# Patient Record
Sex: Female | Born: 1980 | Race: Black or African American | Hispanic: Yes | Smoking: Former smoker
Health system: Southern US, Community
[De-identification: ages and names within clinical notes are randomized; demographics above are authoritative.]

## PROBLEM LIST (undated history)

## (undated) DIAGNOSIS — F32A Depression, unspecified: Secondary | ICD-10-CM

## (undated) DIAGNOSIS — B999 Unspecified infectious disease: Secondary | ICD-10-CM

## (undated) DIAGNOSIS — N750 Cyst of Bartholin's gland: Secondary | ICD-10-CM

## (undated) DIAGNOSIS — F329 Major depressive disorder, single episode, unspecified: Secondary | ICD-10-CM

## (undated) HISTORY — PX: BARTHOLIN GLAND CYST EXCISION: SHX565

---

## 2010-07-09 ENCOUNTER — Emergency Department (HOSPITAL_COMMUNITY): Payer: Medicaid Other

## 2010-07-09 ENCOUNTER — Other Ambulatory Visit (HOSPITAL_COMMUNITY): Payer: Self-pay

## 2010-07-09 ENCOUNTER — Emergency Department (HOSPITAL_COMMUNITY)
Admission: EM | Admit: 2010-07-09 | Discharge: 2010-07-09 | Disposition: A | Discharge status: Home / Self Care | Payer: Medicaid Other | Attending: Emergency Medicine | Admitting: Emergency Medicine

## 2010-07-09 DIAGNOSIS — R109 Unspecified abdominal pain: Secondary | ICD-10-CM | POA: Insufficient documentation

## 2010-07-09 DIAGNOSIS — R42 Dizziness and giddiness: Secondary | ICD-10-CM | POA: Insufficient documentation

## 2010-07-09 DIAGNOSIS — O9989 Other specified diseases and conditions complicating pregnancy, childbirth and the puerperium: Secondary | ICD-10-CM | POA: Insufficient documentation

## 2010-07-09 DIAGNOSIS — R110 Nausea: Secondary | ICD-10-CM | POA: Insufficient documentation

## 2010-07-09 DIAGNOSIS — R51 Headache: Secondary | ICD-10-CM | POA: Insufficient documentation

## 2010-07-09 DIAGNOSIS — N898 Other specified noninflammatory disorders of vagina: Secondary | ICD-10-CM | POA: Insufficient documentation

## 2010-07-09 LAB — DIFFERENTIAL
Basophils Absolute: 0 10*3/uL (ref 0.0–0.1)
Basophils Relative: 0 % (ref 0–1)
Eosinophils Relative: 0 % (ref 0–5)
Monocytes Absolute: 0.6 10*3/uL (ref 0.1–1.0)

## 2010-07-09 LAB — POCT PREGNANCY, URINE: Preg Test, Ur: POSITIVE

## 2010-07-09 LAB — CBC
MCHC: 34.4 g/dL (ref 30.0–36.0)
Platelets: 192 10*3/uL (ref 150–400)
RDW: 13 % (ref 11.5–15.5)
WBC: 10.9 10*3/uL — ABNORMAL HIGH (ref 4.0–10.5)

## 2010-07-09 LAB — WET PREP, GENITAL
Trich, Wet Prep: NONE SEEN
Yeast Wet Prep HPF POC: NONE SEEN

## 2010-07-09 LAB — URINALYSIS, ROUTINE W REFLEX MICROSCOPIC
Ketones, ur: 15 mg/dL — AB
Nitrite: NEGATIVE
Specific Gravity, Urine: 1.026 (ref 1.005–1.030)
Urobilinogen, UA: 1 mg/dL (ref 0.0–1.0)

## 2010-07-09 LAB — POCT I-STAT, CHEM 8
Chloride: 106 mEq/L (ref 96–112)
HCT: 43 % (ref 36.0–46.0)
Hemoglobin: 14.6 g/dL (ref 12.0–15.0)
Potassium: 4.1 mEq/L (ref 3.5–5.1)
Sodium: 137 mEq/L (ref 135–145)

## 2010-07-10 LAB — GC/CHLAMYDIA PROBE AMP, GENITAL: Chlamydia, DNA Probe: NEGATIVE

## 2010-08-17 ENCOUNTER — Emergency Department (HOSPITAL_COMMUNITY)
Admission: EM | Admit: 2010-08-17 | Discharge: 2010-08-17 | Disposition: A | Discharge status: Home / Self Care | Payer: Medicaid Other | Attending: Emergency Medicine | Admitting: Emergency Medicine

## 2010-08-17 DIAGNOSIS — O99891 Other specified diseases and conditions complicating pregnancy: Secondary | ICD-10-CM | POA: Insufficient documentation

## 2010-08-17 DIAGNOSIS — O200 Threatened abortion: Secondary | ICD-10-CM | POA: Insufficient documentation

## 2010-08-17 DIAGNOSIS — R109 Unspecified abdominal pain: Secondary | ICD-10-CM | POA: Insufficient documentation

## 2010-08-17 LAB — URINALYSIS, ROUTINE W REFLEX MICROSCOPIC
Bilirubin Urine: NEGATIVE
Nitrite: NEGATIVE
Specific Gravity, Urine: 1.019 (ref 1.005–1.030)
Urobilinogen, UA: 1 mg/dL (ref 0.0–1.0)

## 2010-08-17 LAB — CBC
HCT: 33.4 % — ABNORMAL LOW (ref 36.0–46.0)
MCHC: 34.4 g/dL (ref 30.0–36.0)
MCV: 84.1 fL (ref 78.0–100.0)
RDW: 13 % (ref 11.5–15.5)

## 2010-08-17 LAB — DIFFERENTIAL
Basophils Absolute: 0 10*3/uL (ref 0.0–0.1)
Eosinophils Relative: 0 % (ref 0–5)
Lymphocytes Relative: 22 % (ref 12–46)
Monocytes Absolute: 0.5 10*3/uL (ref 0.1–1.0)

## 2010-08-17 LAB — WET PREP, GENITAL: Clue Cells Wet Prep HPF POC: NONE SEEN

## 2010-08-17 LAB — URINE MICROSCOPIC-ADD ON

## 2010-08-22 ENCOUNTER — Inpatient Hospital Stay (HOSPITAL_COMMUNITY)
Admission: EM | Admit: 2010-08-22 | Discharge: 2010-08-22 | Disposition: A | Discharge status: Home / Self Care | Payer: Medicaid Other | Source: Ambulatory Visit | Attending: Obstetrics & Gynecology | Admitting: Obstetrics & Gynecology

## 2010-08-22 ENCOUNTER — Encounter (HOSPITAL_COMMUNITY): Payer: Self-pay

## 2010-08-22 DIAGNOSIS — N750 Cyst of Bartholin's gland: Secondary | ICD-10-CM | POA: Insufficient documentation

## 2010-08-22 DIAGNOSIS — N751 Abscess of Bartholin's gland: Secondary | ICD-10-CM

## 2010-08-22 HISTORY — DX: Cyst of Bartholin's gland: N75.0

## 2010-08-22 MED ORDER — HYDROCODONE-ACETAMINOPHEN 5-500 MG PO TABS: 1.0000 | ORAL_TABLET | Freq: Four times a day (QID) | ORAL | Status: DC | PRN

## 2010-08-22 MED ORDER — HYDROCODONE-ACETAMINOPHEN 5-325 MG PO TABS
1.0000 | ORAL_TABLET | Freq: Once | ORAL | Status: AC
  Administered 2010-08-22: 1 via ORAL
  Filled 2010-08-22: qty 1

## 2010-08-22 MED ORDER — LIDOCAINE HCL 2 % EX GEL
Freq: Once | CUTANEOUS | Status: AC
  Administered 2010-08-22: 20 via TOPICAL
  Filled 2010-08-22: qty 20

## 2010-08-22 MED ORDER — CEPHALEXIN 500 MG PO CAPS: 500.0000 mg | ORAL_CAPSULE | Freq: Three times a day (TID) | ORAL | Status: DC

## 2010-08-22 NOTE — ED Provider Notes (Signed)
History    General Appearance:    Alert, cooperative, no distress, appears stated age  Head:    Normocephalic, without obvious abnormality, atraumatic  Back:     Symmetric, no curvature, ROM normal, no CVA tenderness  Lungs:      respirations unlabored  Genitalia:    Normal female without lesions  Extremities:   Extremities normal, atraumatic, no cyanosis or edema  Skin:   Skin color, texture, turgor normal, no rashes or lesions  Neurologic: Alert and oriented, normal gait.    Chief Complaint  Patient presents with  . Bartholin's Cyst   The history is provided by the patient.    OB History    Grav Para Term Preterm Abortions TAB SAB Ect Mult Living   7 5 5  1 1    5       Past Medical History  Diagnosis Date  . Bartholin cyst     Past Surgical History  Procedure Date  . Bartholin gland cyst excision     No family history on file.  History  Substance Use Topics  . Smoking status: Former Games developer  . Smokeless tobacco: Never Used  . Alcohol Use: No    Allergies: No Known Allergies  No prescriptions prior to admission   Swelling and pain left vaginal area. Review of Systems  All other systems reviewed and are negative.   Physical Exam   Blood pressure 113/64, pulse 103, temperature 98.4 F (36.9 C), temperature source Oral, resp. rate 16, height 5\' 4"  (1.626 m), weight 105 lb 12.8 oz (47.991 kg), last menstrual period 05/06/2010.  Physical Exam  Nursing note and vitals reviewed. Constitutional: She is oriented to person, place, and time. She appears well-developed and well-nourished. No distress.  HENT:  Head: Normocephalic.  Eyes: EOM are normal.  Respiratory: Effort normal.  GI: Soft. There is no tenderness.  Genitourinary:    There is tenderness on the left labia.       Bartholin's abscess, tender and swollen.  Musculoskeletal: Normal range of motion.  Neurological: She is alert and oriented to person, place, and time.  Skin: Skin is warm and dry.      MAU Course  INCISION AND DRAINAGE Performed by: Kerrie Buffalo Authorized by: Kerrie Buffalo   Consent form signed. Time out.  Patient positioned and draped with sterile towels. Lidocaine gel applied to the area. Pt. Given percocet po for pain prior to procedure.  Area cleaned with betadine. Anesthized with lidocaine 1%. Opened and drained with #1 blade. Drained moderate amount of purulent drainage. Probed with curved hemostat to break up loculations. Irrigated with NSS. Word Catheter placed and inflated with 3 cc. Of NS.  Pt. tolerated procedure well.   MDM Will treat patient with antibiotics and pain medication. She will f/u for her [facility] and have the catheter removed then.   [place], NP 08/22/10 1614  [place], NP 08/22/10 1624

## 2010-08-22 NOTE — Progress Notes (Signed)
I&D by H.NEESE,NP. Small amount of serosagouns/purilent drainage out. Ward cath placed.

## 2010-08-22 NOTE — Progress Notes (Signed)
Prior bartholin's cyst 58yrs ago, has returned, Intermittent vaginal d/c, denies bleeding.

## 2010-08-22 NOTE — Progress Notes (Signed)
Up to BR, pt "leaking" blood tinged fluid.

## 2010-08-22 NOTE — Progress Notes (Signed)
Left labia swollen and painful, no redness noted.  States pain started yesterday and worsened overnight.  Had Bartholin cyst 8 yrs ago, was opened on the inside and drained, no catheter placed.

## 2010-08-27 ENCOUNTER — Inpatient Hospital Stay (HOSPITAL_COMMUNITY)
Admission: AD | Admit: 2010-08-27 | Discharge: 2010-08-27 | Disposition: A | Discharge status: Home / Self Care | Payer: Medicaid Other | Source: Ambulatory Visit | Attending: Obstetrics & Gynecology | Admitting: Obstetrics & Gynecology

## 2010-08-27 ENCOUNTER — Encounter (HOSPITAL_COMMUNITY): Payer: Self-pay

## 2010-08-27 DIAGNOSIS — N750 Cyst of Bartholin's gland: Secondary | ICD-10-CM | POA: Insufficient documentation

## 2010-08-27 DIAGNOSIS — Z4682 Encounter for fitting and adjustment of non-vascular catheter: Secondary | ICD-10-CM | POA: Insufficient documentation

## 2010-08-27 DIAGNOSIS — O2390 Unspecified genitourinary tract infection in pregnancy, unspecified trimester: Secondary | ICD-10-CM | POA: Insufficient documentation

## 2010-08-27 DIAGNOSIS — N751 Abscess of Bartholin's gland: Secondary | ICD-10-CM

## 2010-08-27 NOTE — Progress Notes (Signed)
WORD catheter removed by Ahlfield PA & Rice PA.

## 2010-08-27 NOTE — Progress Notes (Signed)
Pt states had bartholins cyst drained last week with catheter placed. States cyst fell out & she tried to put it back in, painful when pushed on cyst.

## 2010-08-27 NOTE — ED Provider Notes (Addendum)
History    patient is a 30 year old black female. She is a G7 P5 Ab1 and is currently [redacted] weeks pregnant. She recently had a Bartholin's abscess incised and drained. She had a word catheter placed. She presents today via EMS stating that her catheter is coming out. She denies any pain. She states she was just concerned and wanted to have it checked out.  Chief Complaint  Patient presents with  . Bartholin's Cyst   HPI  OB History    Grav Para Term Preterm Abortions TAB SAB Ect Mult Living   7 5 5  1 1    5       Past Medical History  Diagnosis Date  . Bartholin cyst   . No pertinent past medical history     Past Surgical History  Procedure Date  . Bartholin gland cyst excision     History reviewed. No pertinent family history.  History  Substance Use Topics  . Smoking status: Former Games developer  . Smokeless tobacco: Never Used  . Alcohol Use: No    Allergies: No Known Allergies  Prescriptions prior to admission  Medication Sig Dispense Refill  . cephALEXin (KEFLEX) 500 MG capsule Take 500 mg by mouth 3 (three) times daily.        Marland Kitchen HYDROcodone-acetaminophen (VICODIN) 5-500 MG per tablet Take 1 tablet by mouth every 6 (six) hours as needed. For pain         Review of Systems  Constitutional: Negative for fever and chills.  Cardiovascular: Negative for chest pain and palpitations.  Gastrointestinal: Negative for nausea, vomiting, abdominal pain, diarrhea and constipation.  Genitourinary: Negative for dysuria, urgency, frequency, hematuria and flank pain.  Neurological: Negative for headaches.  Psychiatric/Behavioral: Negative for depression and suicidal ideas.   Physical Exam   Blood pressure 89/49, pulse 63, temperature 98.5 F (36.9 C), temperature source Oral, resp. rate 16, height 5\' 4"  (1.626 m), weight 105 lb (47.628 kg), last menstrual period 05/06/2010, SpO2 99.00%.  Physical Exam  Constitutional: She is oriented to person, place, and time. She appears  well-developed and well-nourished. No distress.  HENT:  Head: Normocephalic and atraumatic.  Eyes: EOM are normal. Pupils are equal, round, and reactive to light.  GI: She exhibits no distension and no mass. There is no tenderness. There is no rebound and no guarding.  Genitourinary:       On examination word catheter is still intact. However it appears to be trying to work its way out. She had a small amount of bleeding at the edges around the catheter.  Neurological: She is alert and oriented to person, place, and time.  Skin: Skin is warm and dry. She is not diaphoretic.  Psychiatric: She has a normal mood and affect. Her behavior is normal. Judgment and thought content normal.    MAU Course  Procedures  The word catheter was easily removed. Patient tolerated this procedure well. She had no pain. The wound appears to be healing secondarily. Assessment and Plan  Bartholin's abscess: She appears to be healing nicely. There is no sign of worsening infection. She has a followup appointment scheduled. She will keep this appointment. I did discuss with her appropriate diet, activities, risks and precautions. She is to begin prenatal care in the [facility] clinic. She had no other problems or questions at this time.  Clinton Gallant. Rice III, DrHSc, MPAS, PA-C  08/27/2010, 10:08 AM   Henrietta Hoover, PA 09/06/10 [id number]

## 2010-09-03 LAB — RPR: RPR: NONREACTIVE

## 2010-09-03 LAB — SICKLE CELL SCREEN: Sickle Cell Screen: NEGATIVE

## 2010-09-09 NOTE — ED Provider Notes (Signed)
Agree with above note.  Merek Niu 09/09/2010 2:27 PM

## 2010-11-30 ENCOUNTER — Inpatient Hospital Stay (HOSPITAL_COMMUNITY): Payer: Medicaid Other

## 2010-11-30 ENCOUNTER — Inpatient Hospital Stay (HOSPITAL_COMMUNITY)
Admission: AD | Admit: 2010-11-30 | Discharge: 2010-11-30 | Disposition: A | Discharge status: Home / Self Care | Payer: Medicaid Other | Source: Ambulatory Visit | Attending: Obstetrics and Gynecology | Admitting: Obstetrics and Gynecology

## 2010-11-30 ENCOUNTER — Encounter (HOSPITAL_COMMUNITY): Payer: Self-pay | Admitting: Obstetrics and Gynecology

## 2010-11-30 ENCOUNTER — Emergency Department (HOSPITAL_COMMUNITY)
Admission: EM | Admit: 2010-11-30 | Discharge: 2010-11-30 | Disposition: A | Discharge status: Nursing Home (Includes ICF) | Payer: Medicaid Other | Attending: Emergency Medicine | Admitting: Emergency Medicine

## 2010-11-30 DIAGNOSIS — B9689 Other specified bacterial agents as the cause of diseases classified elsewhere: Secondary | ICD-10-CM | POA: Insufficient documentation

## 2010-11-30 DIAGNOSIS — R109 Unspecified abdominal pain: Secondary | ICD-10-CM | POA: Insufficient documentation

## 2010-11-30 DIAGNOSIS — O30009 Twin pregnancy, unspecified number of placenta and unspecified number of amniotic sacs, unspecified trimester: Secondary | ICD-10-CM | POA: Insufficient documentation

## 2010-11-30 DIAGNOSIS — N760 Acute vaginitis: Secondary | ICD-10-CM | POA: Insufficient documentation

## 2010-11-30 DIAGNOSIS — W1809XA Striking against other object with subsequent fall, initial encounter: Secondary | ICD-10-CM | POA: Insufficient documentation

## 2010-11-30 DIAGNOSIS — O99891 Other specified diseases and conditions complicating pregnancy: Secondary | ICD-10-CM | POA: Insufficient documentation

## 2010-11-30 DIAGNOSIS — S0010XA Contusion of unspecified eyelid and periocular area, initial encounter: Secondary | ICD-10-CM | POA: Insufficient documentation

## 2010-11-30 DIAGNOSIS — R220 Localized swelling, mass and lump, head: Secondary | ICD-10-CM | POA: Insufficient documentation

## 2010-11-30 DIAGNOSIS — O4700 False labor before 37 completed weeks of gestation, unspecified trimester: Secondary | ICD-10-CM | POA: Insufficient documentation

## 2010-11-30 DIAGNOSIS — A499 Bacterial infection, unspecified: Secondary | ICD-10-CM | POA: Insufficient documentation

## 2010-11-30 DIAGNOSIS — O2390 Unspecified genitourinary tract infection in pregnancy, unspecified trimester: Secondary | ICD-10-CM | POA: Insufficient documentation

## 2010-11-30 DIAGNOSIS — S022XXA Fracture of nasal bones, initial encounter for closed fracture: Secondary | ICD-10-CM | POA: Insufficient documentation

## 2010-11-30 HISTORY — DX: Major depressive disorder, single episode, unspecified: F32.9

## 2010-11-30 HISTORY — DX: Depression, unspecified: F32.A

## 2010-11-30 LAB — URINALYSIS, DIPSTICK ONLY
Bilirubin Urine: NEGATIVE
Hgb urine dipstick: NEGATIVE
Ketones, ur: NEGATIVE mg/dL
Nitrite: NEGATIVE
Specific Gravity, Urine: 1.01 (ref 1.005–1.030)
pH: 7 (ref 5.0–8.0)

## 2010-11-30 LAB — WET PREP, GENITAL
Trich, Wet Prep: NONE SEEN
Yeast Wet Prep HPF POC: NONE SEEN

## 2010-11-30 MED ORDER — LACTATED RINGERS IV SOLN
Freq: Once | INTRAVENOUS | Status: AC
  Administered 2010-11-30: 500 mL via INTRAVENOUS

## 2010-11-30 MED ORDER — METRONIDAZOLE 500 MG PO TABS: 500.0000 mg | ORAL_TABLET | Freq: Two times a day (BID) | ORAL | Status: AC

## 2010-11-30 MED ORDER — TERBUTALINE SULFATE 1 MG/ML IJ SOLN
0.2500 mg | Freq: Once | INTRAMUSCULAR | Status: AC
  Administered 2010-11-30: 0.25 mg via SUBCUTANEOUS
  Filled 2010-11-30: qty 1

## 2010-11-30 NOTE — Progress Notes (Signed)
N.Smith,CNM at bedside to discuss results and discharge.
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
## 2010-11-30 NOTE — Progress Notes (Signed)
Pt off monitor to go to U/S. Terbutaline and IV bolus given prior to leaving
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
## 2010-12-02 LAB — GC/CHLAMYDIA PROBE AMP, GENITAL: Chlamydia, DNA Probe: NEGATIVE

## 2010-12-03 LAB — CULTURE, BETA STREP (GROUP B ONLY)

## 2010-12-31 ENCOUNTER — Inpatient Hospital Stay (HOSPITAL_COMMUNITY)
Admission: AD | Admit: 2010-12-31 | Discharge: 2011-01-03 | DRG: 767 | Disposition: A | Discharge status: Home / Self Care | Payer: Medicaid Other | Source: Ambulatory Visit | Attending: Obstetrics and Gynecology | Admitting: Obstetrics and Gynecology

## 2010-12-31 ENCOUNTER — Inpatient Hospital Stay (HOSPITAL_COMMUNITY): Payer: Medicaid Other

## 2010-12-31 ENCOUNTER — Encounter (HOSPITAL_COMMUNITY): Payer: Self-pay | Admitting: *Deleted

## 2010-12-31 DIAGNOSIS — D649 Anemia, unspecified: Secondary | ICD-10-CM | POA: Diagnosis present

## 2010-12-31 DIAGNOSIS — Z22330 Carrier of Group B streptococcus: Secondary | ICD-10-CM

## 2010-12-31 DIAGNOSIS — O3090 Multiple gestation, unspecified, unspecified trimester: Secondary | ICD-10-CM | POA: Diagnosis present

## 2010-12-31 DIAGNOSIS — Z302 Encounter for sterilization: Secondary | ICD-10-CM

## 2010-12-31 DIAGNOSIS — M7989 Other specified soft tissue disorders: Secondary | ICD-10-CM

## 2010-12-31 DIAGNOSIS — IMO0002 Reserved for concepts with insufficient information to code with codable children: Secondary | ICD-10-CM

## 2010-12-31 DIAGNOSIS — O99892 Other specified diseases and conditions complicating childbirth: Principal | ICD-10-CM | POA: Diagnosis present

## 2010-12-31 DIAGNOSIS — R600 Localized edema: Secondary | ICD-10-CM

## 2010-12-31 DIAGNOSIS — O329XX0 Maternal care for malpresentation of fetus, unspecified, not applicable or unspecified: Secondary | ICD-10-CM | POA: Diagnosis present

## 2010-12-31 DIAGNOSIS — B009 Herpesviral infection, unspecified: Secondary | ICD-10-CM

## 2010-12-31 DIAGNOSIS — M79609 Pain in unspecified limb: Secondary | ICD-10-CM

## 2010-12-31 DIAGNOSIS — Z9851 Tubal ligation status: Secondary | ICD-10-CM

## 2010-12-31 DIAGNOSIS — IMO0001 Reserved for inherently not codable concepts without codable children: Secondary | ICD-10-CM

## 2010-12-31 DIAGNOSIS — O30009 Twin pregnancy, unspecified number of placenta and unspecified number of amniotic sacs, unspecified trimester: Secondary | ICD-10-CM

## 2010-12-31 DIAGNOSIS — Z641 Problems related to multiparity: Secondary | ICD-10-CM

## 2010-12-31 HISTORY — DX: Unspecified infectious disease: B99.9

## 2010-12-31 LAB — CBC
Hemoglobin: 10.1 g/dL — ABNORMAL LOW (ref 12.0–15.0)
MCH: 26 pg (ref 26.0–34.0)
RBC: 3.89 MIL/uL (ref 3.87–5.11)

## 2010-12-31 LAB — RPR: RPR Ser Ql: NONREACTIVE

## 2010-12-31 MED ORDER — EPHEDRINE 5 MG/ML INJ: 10.0000 mg | INTRAVENOUS | Status: DC | PRN

## 2010-12-31 MED ORDER — FLEET ENEMA 7-19 GM/118ML RE ENEM: 1.0000 | ENEMA | RECTAL | Status: DC | PRN

## 2010-12-31 MED ORDER — LACTATED RINGERS IV SOLN
INTRAVENOUS | Status: DC
  Administered 2010-12-31: 18:00:00 via INTRAVENOUS

## 2010-12-31 MED ORDER — BUTORPHANOL TARTRATE 2 MG/ML IJ SOLN: 1.0000 mg | INTRAMUSCULAR | Status: DC | PRN

## 2010-12-31 MED ORDER — OXYTOCIN BOLUS FROM INFUSION
500.0000 mL | Freq: Once | INTRAVENOUS | Status: DC
  Filled 2010-12-31: qty 500

## 2010-12-31 MED ORDER — ZOLPIDEM TARTRATE 10 MG PO TABS: 10.0000 mg | ORAL_TABLET | Freq: Every evening | ORAL | Status: DC | PRN

## 2010-12-31 MED ORDER — HYDROXYZINE HCL 50 MG PO TABS: 50.0000 mg | ORAL_TABLET | Freq: Four times a day (QID) | ORAL | Status: DC | PRN

## 2010-12-31 MED ORDER — PENICILLIN G POTASSIUM 5000000 UNITS IJ SOLR
5.0000 10*6.[IU] | Freq: Once | INTRAVENOUS | Status: DC
  Administered 2010-12-31: 5 10*6.[IU] via INTRAVENOUS
  Filled 2010-12-31: qty 5

## 2010-12-31 MED ORDER — FENTANYL 2.5 MCG/ML BUPIVACAINE 1/10 % EPIDURAL INFUSION (WH - ANES): 14.0000 mL/h | INTRAMUSCULAR | Status: DC

## 2010-12-31 MED ORDER — DIPHENHYDRAMINE HCL 50 MG/ML IJ SOLN: 12.5000 mg | INTRAMUSCULAR | Status: DC | PRN

## 2010-12-31 MED ORDER — PHENYLEPHRINE 40 MCG/ML (10ML) SYRINGE FOR IV PUSH (FOR BLOOD PRESSURE SUPPORT): 80.0000 ug | PREFILLED_SYRINGE | INTRAVENOUS | Status: DC | PRN

## 2010-12-31 MED ORDER — OXYCODONE-ACETAMINOPHEN 5-325 MG PO TABS: 2.0000 | ORAL_TABLET | ORAL | Status: DC | PRN

## 2010-12-31 MED ORDER — ONDANSETRON HCL 4 MG/2ML IJ SOLN: 4.0000 mg | Freq: Four times a day (QID) | INTRAMUSCULAR | Status: DC | PRN

## 2010-12-31 MED ORDER — HYDROXYZINE HCL 50 MG/ML IM SOLN: 50.0000 mg | Freq: Four times a day (QID) | INTRAMUSCULAR | Status: DC | PRN

## 2010-12-31 MED ORDER — LIDOCAINE HCL (PF) 1 % IJ SOLN: 30.0000 mL | INTRAMUSCULAR | Status: DC | PRN

## 2010-12-31 MED ORDER — LACTATED RINGERS IV SOLN: 500.0000 mL | INTRAVENOUS | Status: DC | PRN

## 2010-12-31 MED ORDER — IBUPROFEN 600 MG PO TABS: 600.0000 mg | ORAL_TABLET | Freq: Four times a day (QID) | ORAL | Status: DC | PRN

## 2010-12-31 MED ORDER — OXYTOCIN 20 UNITS IN LACTATED RINGERS INFUSION - SIMPLE: 125.0000 mL/h | Freq: Once | INTRAVENOUS | Status: DC

## 2010-12-31 MED ORDER — PENICILLIN G POTASSIUM 5000000 UNITS IJ SOLR
2.5000 10*6.[IU] | INTRAMUSCULAR | Status: DC
  Administered 2010-12-31: 2.5 10*6.[IU] via INTRAVENOUS
  Filled 2010-12-31 (×3): qty 2.5

## 2010-12-31 MED ORDER — LACTATED RINGERS IV SOLN: 500.0000 mL | Freq: Once | INTRAVENOUS | Status: DC

## 2010-12-31 MED ORDER — ACETAMINOPHEN 325 MG PO TABS: 650.0000 mg | ORAL_TABLET | ORAL | Status: DC | PRN

## 2010-12-31 MED ORDER — CITRIC ACID-SODIUM CITRATE 334-500 MG/5ML PO SOLN: 30.0000 mL | ORAL | Status: DC | PRN
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
## 2010-12-31 NOTE — Progress Notes (Signed)
*  PRELIMINARY RESULTS*  Left lower extremity venous duplex  has been performed.  Right common femoral vein and left lower extremity veins are negative for acute deep and superficial vein thrombosis.  Haley Shepherd 12/31/2010, 11:14 PM

## 2010-12-31 NOTE — Progress Notes (Signed)
Haley Shepherd is a 30 y.o. [id number] at [redacted]w[redacted]d by LMP admitted for contractions and LLE swelling.  Pt denies having in abd or leg pain.  No SOB or CP  Subjective:   Objective: BP 108/71  Pulse 95  Temp(Src) 98.5 F (36.9 C) (Oral)  Resp 20  Ht 5\' 4"  (1.626 m)  Wt 62.143 kg (137 lb)  BMI 23.52 kg/m2  LMP 05/06/2010      FHT:  FHR: a-120,b-135 bpm, variability: moderate,  accelerations:  Present,  decelerations:  Absent UC:   irregular, every 5-10 minutes SVE:   Dilation: 4.5 Effacement (%): 70 Station: -2 Exam by:: [employer], cnm  Labs: Lab Results  Component Value Date   WBC 10.6* 12/31/2010   HGB 10.1* 12/31/2010   HCT 32.2* 12/31/2010   MCV 82.8 12/31/2010   PLT 238 12/31/2010    Assessment / Plan: contractions with LLE swelling  Labor: will recheck cervix if no cervical change and contractions remain irreg will send to antepartum.   Preeclampsia:  no signs or symptoms of toxicity Fetal Wellbeing:  Category I Pain Control:  pt comfortable without meds I/D:  will do LLE doppler to R/O DVT Anticipated MOD:  pt desires svd if she does go into labor.  presenting twin is vtx.  Twin b is breech.  pt given option of cs vs svd.  risks and benefits reviewed with the patient.    Rocco Kerkhoff A 12/31/2010, 8:12 PM

## 2011-01-01 ENCOUNTER — Other Ambulatory Visit: Payer: Self-pay | Admitting: Obstetrics and Gynecology

## 2011-01-01 ENCOUNTER — Encounter (HOSPITAL_COMMUNITY)
Admission: AD | Disposition: A | Discharge status: Home / Self Care | Payer: Self-pay | Source: Ambulatory Visit | Attending: Obstetrics and Gynecology

## 2011-01-01 ENCOUNTER — Encounter (HOSPITAL_COMMUNITY): Payer: Self-pay | Admitting: *Deleted

## 2011-01-01 SURGERY — Surgical Case
Anesthesia: Epidural | Wound class: Clean Contaminated

## 2011-01-01 MED ORDER — SODIUM CHLORIDE 0.9 % IV SOLN
2.0000 g | Freq: Once | INTRAVENOUS | Status: AC
  Administered 2011-01-01: 2 g via INTRAVENOUS
  Filled 2011-01-01: qty 2000

## 2011-01-01 MED ORDER — HYDROXYZINE HCL 50 MG/ML IM SOLN: 50.0000 mg | Freq: Four times a day (QID) | INTRAMUSCULAR | Status: DC | PRN

## 2011-01-01 MED ORDER — EPHEDRINE 5 MG/ML INJ: 10.0000 mg | INTRAVENOUS | Status: DC | PRN

## 2011-01-01 MED ORDER — HYDROXYZINE HCL 50 MG PO TABS: 50.0000 mg | ORAL_TABLET | Freq: Four times a day (QID) | ORAL | Status: DC | PRN

## 2011-01-01 MED ORDER — OXYTOCIN 20 UNITS IN LACTATED RINGERS INFUSION - SIMPLE: 125.0000 mL/h | Freq: Once | INTRAVENOUS | Status: DC

## 2011-01-01 MED ORDER — DOCUSATE SODIUM 100 MG PO CAPS
100.0000 mg | ORAL_CAPSULE | Freq: Every day | ORAL | Status: DC
  Filled 2011-01-01: qty 1

## 2011-01-01 MED ORDER — LIDOCAINE HCL 1.5 % IJ SOLN
INTRAMUSCULAR | Status: DC | PRN
  Administered 2011-01-01: 5 mL via INTRADERMAL
  Administered 2011-01-01: 2 mL via INTRADERMAL
  Administered 2011-01-01: 5 mL via INTRADERMAL

## 2011-01-01 MED ORDER — ONDANSETRON HCL 4 MG/2ML IJ SOLN: 4.0000 mg | Freq: Four times a day (QID) | INTRAMUSCULAR | Status: DC | PRN

## 2011-01-01 MED ORDER — BUTORPHANOL TARTRATE 2 MG/ML IJ SOLN
1.0000 mg | INTRAMUSCULAR | Status: DC | PRN
  Administered 2011-01-01: 1 mg via INTRAVENOUS
  Filled 2011-01-01: qty 1

## 2011-01-01 MED ORDER — ACETAMINOPHEN 325 MG PO TABS: 650.0000 mg | ORAL_TABLET | ORAL | Status: DC | PRN

## 2011-01-01 MED ORDER — LACTATED RINGERS IV SOLN: 500.0000 mL | Freq: Once | INTRAVENOUS | Status: DC

## 2011-01-01 MED ORDER — LIDOCAINE HCL (PF) 1 % IJ SOLN
30.0000 mL | INTRAMUSCULAR | Status: DC | PRN
  Filled 2011-01-01: qty 30

## 2011-01-01 MED ORDER — FENTANYL 2.5 MCG/ML BUPIVACAINE 1/10 % EPIDURAL INFUSION (WH - ANES)
14.0000 mL/h | INTRAMUSCULAR | Status: DC
  Administered 2011-01-01: 14 mL/h via EPIDURAL
  Filled 2011-01-01: qty 60

## 2011-01-01 MED ORDER — CITRIC ACID-SODIUM CITRATE 334-500 MG/5ML PO SOLN
30.0000 mL | ORAL | Status: DC | PRN
  Filled 2011-01-01: qty 15

## 2011-01-01 MED ORDER — LACTATED RINGERS IV SOLN: 500.0000 mL | INTRAVENOUS | Status: DC | PRN

## 2011-01-01 MED ORDER — PHENYLEPHRINE 40 MCG/ML (10ML) SYRINGE FOR IV PUSH (FOR BLOOD PRESSURE SUPPORT)
80.0000 ug | PREFILLED_SYRINGE | INTRAVENOUS | Status: DC | PRN
  Filled 2011-01-01: qty 5

## 2011-01-01 MED ORDER — FLEET ENEMA 7-19 GM/118ML RE ENEM: 1.0000 | ENEMA | RECTAL | Status: DC | PRN

## 2011-01-01 MED ORDER — PHENYLEPHRINE 40 MCG/ML (10ML) SYRINGE FOR IV PUSH (FOR BLOOD PRESSURE SUPPORT): 80.0000 ug | PREFILLED_SYRINGE | INTRAVENOUS | Status: DC | PRN

## 2011-01-01 MED ORDER — DIPHENHYDRAMINE HCL 50 MG/ML IJ SOLN: 12.5000 mg | INTRAMUSCULAR | Status: DC | PRN

## 2011-01-01 MED ORDER — OXYTOCIN BOLUS FROM INFUSION
500.0000 mL | Freq: Once | INTRAVENOUS | Status: DC
  Filled 2011-01-01: qty 500
  Filled 2011-01-01: qty 1000

## 2011-01-01 MED ORDER — SODIUM CHLORIDE 0.9 % IV SOLN
1.0000 g | Freq: Four times a day (QID) | INTRAVENOUS | Status: DC
  Filled 2011-01-01 (×4): qty 1000

## 2011-01-01 MED ORDER — IBUPROFEN 600 MG PO TABS: 600.0000 mg | ORAL_TABLET | Freq: Four times a day (QID) | ORAL | Status: DC | PRN

## 2011-01-01 MED ORDER — PRENATAL PLUS 27-1 MG PO TABS
1.0000 | ORAL_TABLET | Freq: Every day | ORAL | Status: DC
  Administered 2011-01-01: 1 via ORAL
  Filled 2011-01-01: qty 1

## 2011-01-01 MED ORDER — LACTATED RINGERS IV SOLN
INTRAVENOUS | Status: DC
  Administered 2011-01-01: 16:00:00 via INTRAVENOUS
  Administered 2011-01-01: 600 mL/h via INTRAVENOUS

## 2011-01-01 MED ORDER — OXYCODONE-ACETAMINOPHEN 5-325 MG PO TABS: 2.0000 | ORAL_TABLET | ORAL | Status: DC | PRN

## 2011-01-01 MED ORDER — EPHEDRINE 5 MG/ML INJ
10.0000 mg | INTRAVENOUS | Status: DC | PRN
  Filled 2011-01-01: qty 4

## 2011-01-01 NOTE — Progress Notes (Signed)
Subjective: Pt states contractions are not intense, and frequency unchanged since admitted.  No LOB, VB, PIH s/s.  GFM x2.  Husband at [facility] and w/ multiple questions about Plan of care.  Pt's LE edema is unchanged and no new symptoms.  Objective: BP 108/69  Pulse 85  Temp(Src) 97.8 F (36.6 C) (Oral)  Resp 18  Ht 5\' 4"  (1.626 m)  Wt 61.78 kg (136 lb 3.2 oz)  BMI 23.38 kg/m2  LMP 05/06/2010      FHT:  FHR: A-130, B-140 bpm, variability: moderate,  accelerations:  Present,  decelerations:  Absent UC:   irregular, every 6-11 minutes SVE:   Dilation: 5 Effacement (%): 70 Station: Ballotable Exam by:: B.Smith RN  Labs: Lab Results  Component Value Date   WBC 10.6* 12/31/2010   HGB 10.1* 12/31/2010   HCT 32.2* 12/31/2010   MCV 82.8 12/31/2010   PLT 238 12/31/2010    Assessment / Plan: 1.  twins at 35.5  2.  No cervical progression since admitted  3.  Advanced dilatation  4.  LLE edema w/ negative doppler last night 5.  Vtx, Breech presentation on admission u/s  1.  Continue current POC 2.  MD to follow r/e extended observation or d/c home   Azaan Leask H 01/01/2011, 11:26 AM

## 2011-01-01 NOTE — Consult Note (Signed)
Neonatology Note:   Attendance at Delivery of twins:    I was asked to attend this NSVD of twins at 47 5/[redacted] weeks gestation. The mother is a G8P5A2 O pos, GBS pos with onset of spontaneous labor. AROM 3 hours prior to delivery for Twin A, fluid clear. Infant delivered vertex and was vigorous with good spontaneous cry and tone. Needed only minimal bulb suctioning. Ap 9/9. Lungs clear to ausc in DR. AROM of sac around Twin B just before delivery. Infant had been breech, but turned to delivery vertex. This infant also had good spontaneous cry and tone, but was dusky and had increased amounts of clear oral and nasal secretions, requiring bulb suctioning several times. We gave BBO2 at 4 minutes as baby still had not pinked up; she responded well to this and remained pink after the BBO2 was withdrawn about 2 minutes later. Lungs clear to ausc. Neither baby had resp distress. As they are doing well, may stay with parents for a few minutes, but advised OB nurse to keep them wrapped well to stay warm; Twin A appears smaller and will need a one touch glucose check, so should go to the central nursery in 20-30 minutes. To CN to care of Pediatrician.   Deatra James, MD

## 2011-01-01 NOTE — Anesthesia Procedure Notes (Signed)
Epidural Patient location during procedure: OB Start time: 01/01/2011 6:48 PM Reason for block: procedure for pain  Staffing Performed by: anesthesiologist   Preanesthetic Checklist Completed: patient identified, site marked, surgical consent, pre-op evaluation, timeout performed, IV checked, risks and benefits discussed and monitors and equipment checked  Epidural Patient position: sitting Prep: site prepped and draped and DuraPrep Patient monitoring: continuous pulse ox and blood pressure Approach: midline Injection technique: LOR air  Needle:  Needle type: Tuohy  Needle gauge: 17 G Needle length: 9 cm Needle insertion depth: 4 cm Catheter type: closed end flexible Catheter size: 19 Gauge Catheter at skin depth: 8 cm Test dose: negative  Assessment Events: blood not aspirated, injection not painful, no injection resistance, negative IV test and no paresthesia  Additional Notes Discussed risk of headache, infection, bleeding, nerve injury and failed or incomplete block.  Patient voices understanding and wishes to proceed.

## 2011-01-01 NOTE — Progress Notes (Signed)
30 y.o. year old female,at [redacted]w[redacted]d gestation.  SUBJECTIVE:  C/O contractions. No HSV signs or symptoms.  OBJECTIVE:  BP 107/64  Pulse 84  Temp(Src) 97.5 F (36.4 C) (Oral)  Resp 18  Ht 5\' 4"  (1.626 m)  Wt 61.78 kg (136 lb 3.2 oz)  BMI 23.38 kg/m2  LMP 05/06/2010  Fetal Heart Tones:  Reactive x 2; Category 1  Contractions:          Mild to moderate  Cervix: 6/75/-1,-2  ASSESSMENT:  [redacted]w[redacted]d Weeks Pregnancy  Twins Vertex/Breech  Prodromal Labor  PLAN:  Will rupture membranes and proceed with vaginal delivery.  Risks of Cesarean reviewed. Patient declines elective CS for second twin breech.  Leonard Schwartz, M.D.

## 2011-01-01 NOTE — Progress Notes (Signed)
6-7/75/-1,-2 AROM Clear IUPC and FSE placed Cat 1 FHT x 2  Leonard Schwartz MD

## 2011-01-01 NOTE — Anesthesia Preprocedure Evaluation (Addendum)
Anesthesia Evaluation  Patient identified by MRN, date of birth, ID band Patient awake    Reviewed: Allergy & Precautions, H&P , NPO status , Patient's Chart, lab work & pertinent test results, reviewed documented beta blocker date and time   History of Anesthesia Complications Negative for: history of anesthetic complications  Airway Mallampati: III TM Distance: >3 FB Neck ROM: full    Dental  (+) Teeth Intact   Pulmonary neg pulmonary ROS,  clear to auscultation        Cardiovascular neg cardio ROS regular Normal    Neuro/Psych PSYCHIATRIC DISORDERS (depression) Negative Neurological ROS     GI/Hepatic negative GI ROS, Neg liver ROS,   Endo/Other  Negative Endocrine ROS  Renal/GU negative Renal ROS  Genitourinary negative   Musculoskeletal   Abdominal   Peds  Hematology negative hematology ROS (+)   Anesthesia Other Findings   Reproductive/Obstetrics (+) Pregnancy (twins - vertex/breech)                          Anesthesia Physical Anesthesia Plan  ASA: II  Anesthesia Plan: Epidural   Post-op Pain Management:    Induction:   Airway Management Planned:   Additional Equipment:   Intra-op Plan:   Post-operative Plan:   Informed Consent: I have reviewed the patients History and Physical, chart, labs and discussed the procedure including the risks, benefits and alternatives for the proposed anesthesia with the patient or authorized representative who has indicated his/her understanding and acceptance.     Plan Discussed with:   Anesthesia Plan Comments: (Prev hx says + MH No prev anesthetic Complications)       Anesthesia Quick Evaluation

## 2011-01-01 NOTE — Op Note (Signed)
OPERATIVE NOTE  Haley Shepherd  DOB:    11/22/1980  MRN:    [id number]  CSN:    [id number]  Date of Surgery:  01/01/2011  Preoperative Diagnosis:  35 week and 5 day gestation  Twins  Active labor  Vertex/ breech presentation  Postoperative Diagnosis:  Same  Procedure:  Vaginal delivery of twin A.  Obstetrical ultrasound  External version of twin B  Vaginal delivery of twin B.  Surgeon:  Leonard Schwartz, M.D.  Assistant:  Nigel Bridgeman, certified nurse midwife  Anesthetic:  Epidural  Disposition:  The patient is a 30 year old female who was admitted at 35 weeks and 5 days gestation. She has [facility] [place] [facility]. She has a twin gestation. The babies are in a vertex and then a breech presentation. We discussed the possibility that a cesarean section may be necessary for delivery of the second infant. We also discussed the possibility of external and internal version for the second infant. We discussed the potential morbidity associated with a breech vaginal delivery. The patient elected to proceed with an attempt a vaginal delivery for both infants. She became completely dilated on the labor and delivery floor. She was brought to the operating room suite for delivery.  Findings:  The first infant delivered was a female Haley Shepherd). The infant was delivered from a vertex presentation. The weight is currently not known. The Apgar scores are 9 at 1 minute and 9 at 5 minutes. The second infant was in a breech presentation. An ultrasound was performed which showed the fetal head to be in the right upper quadrant. The amniotic fluid volume was normal. The fetal heart motions were normal. We successfully rotate the infant to a vertex presentation. A female infant Haley Shepherd) was delivered from a vertex presentation. The Apgar scores were 8 at 1 minute and 9 at 5 minutes. The weight is currently  not known. Both umbilical cord had 3 vessels. There were 2 separate placentas. The placenta was sent to pathology. There were no perineal, vaginal, or cervical lacerations after the delivery.  Procedure:  The patient was taken to the operating room for delivery. She was placed in a lithotomy position. The perineum was prepped with Betadine. The bladder was drained of urine. The mother was allowed to push and she easily delivered the first infant. The cord was clamped and cut and infant was handed to the waiting pediatric team. We then checked into early for the presentation. The head was not present in the pelvis and the foot could not be palpated. The abdomen was examined and the fetal head was noted to be in the right upper quadrant. An ultrasound was then performed which showed a single intrauterine gestation with the breech presenting and with the head in the right upper quadrant. The amniotic fluid volume was normal. The fetal heart motion was normal. Gel was placed in the patient's abdomen. With a single counterclockwise rotation we were able to bring the fetal head to the pelvis. The membranes were ruptured. The amniotic fluid was clear. The patient easily delivered the second infant via vaginal delivery. The cord was clamped and cut. The infant was handed to the waiting pediatric team. Cord blood studies were obtained from both cords. The placenta delivered easily. The fundus of the uterus was massaged. Hemostasis was adequate. We inspected the perineum, vagina, and the cervix. No lacerations were present. The patient was returned to the supine presentation. She  was transported to the labor and delivery suite in stable condition. The placentas were sent to pathology. The infants were allowed to remain with the parents for bonding.  The mother desires sterilization. The postpartum tubal sterilization procedure was discussed. The risk and benefits of the procedure were outlined. The specific risks were  described including, but not limited to, anesthetic complications, bleeding, infections, possible damage to the surrounding organs, and possible tubal failure (17 per 1000). We will schedule the procedure. The patient will remain NPO prior to the procedure.  Leonard Schwartz, M.D.

## 2011-01-02 ENCOUNTER — Encounter (HOSPITAL_COMMUNITY): Payer: Self-pay | Admitting: Anesthesiology

## 2011-01-02 ENCOUNTER — Inpatient Hospital Stay (HOSPITAL_COMMUNITY): Payer: Medicaid Other | Admitting: Anesthesiology

## 2011-01-02 ENCOUNTER — Encounter (HOSPITAL_COMMUNITY)
Admission: AD | Disposition: A | Discharge status: Home / Self Care | Payer: Self-pay | Source: Ambulatory Visit | Attending: Obstetrics and Gynecology

## 2011-01-02 HISTORY — PX: TUBAL LIGATION: SHX77

## 2011-01-02 LAB — CBC
HCT: 27.4 % — ABNORMAL LOW (ref 36.0–46.0)
Hemoglobin: 8.6 g/dL — ABNORMAL LOW (ref 12.0–15.0)
MCH: 25.9 pg — ABNORMAL LOW (ref 26.0–34.0)
MCHC: 31.4 g/dL (ref 30.0–36.0)

## 2011-01-02 SURGERY — LIGATION, FALLOPIAN TUBE, POSTPARTUM
Anesthesia: Epidural | Site: Abdomen | Laterality: Bilateral | Wound class: Clean Contaminated

## 2011-01-02 MED ORDER — ZOLPIDEM TARTRATE 5 MG PO TABS: 5.0000 mg | ORAL_TABLET | Freq: Every evening | ORAL | Status: DC | PRN

## 2011-01-02 MED ORDER — LIDOCAINE-EPINEPHRINE (PF) 2 %-1:200000 IJ SOLN
INTRAMUSCULAR | Status: AC
  Filled 2011-01-02: qty 20

## 2011-01-02 MED ORDER — TETANUS-DIPHTH-ACELL PERTUSSIS 5-2.5-18.5 LF-MCG/0.5 IM SUSP: 0.5000 mL | Freq: Once | INTRAMUSCULAR | Status: DC

## 2011-01-02 MED ORDER — LACTATED RINGERS IV SOLN
INTRAVENOUS | Status: DC
  Administered 2011-01-02 (×2): via INTRAVENOUS

## 2011-01-02 MED ORDER — WITCH HAZEL-GLYCERIN EX PADS: 1.0000 "application " | MEDICATED_PAD | CUTANEOUS | Status: DC | PRN

## 2011-01-02 MED ORDER — LANOLIN HYDROUS EX OINT: TOPICAL_OINTMENT | CUTANEOUS | Status: DC | PRN

## 2011-01-02 MED ORDER — OXYCODONE-ACETAMINOPHEN 5-325 MG PO TABS
1.0000 | ORAL_TABLET | ORAL | Status: DC | PRN
  Administered 2011-01-02: 1 via ORAL
  Administered 2011-01-02: 2 via ORAL
  Administered 2011-01-02: 1 via ORAL
  Administered 2011-01-02: 2 via ORAL
  Administered 2011-01-02: 1 via ORAL
  Administered 2011-01-03 (×2): 2 via ORAL
  Filled 2011-01-02 (×3): qty 2
  Filled 2011-01-02: qty 1
  Filled 2011-01-02 (×2): qty 2
  Filled 2011-01-02: qty 1

## 2011-01-02 MED ORDER — DIPHENHYDRAMINE HCL 25 MG PO CAPS: 25.0000 mg | ORAL_CAPSULE | Freq: Four times a day (QID) | ORAL | Status: DC | PRN

## 2011-01-02 MED ORDER — SODIUM BICARBONATE 8.4 % IV SOLN
INTRAVENOUS | Status: AC
  Filled 2011-01-02: qty 100

## 2011-01-02 MED ORDER — HYDROMORPHONE HCL PF 1 MG/ML IJ SOLN: 0.2500 mg | INTRAMUSCULAR | Status: DC | PRN

## 2011-01-02 MED ORDER — FAMOTIDINE 20 MG PO TABS
40.0000 mg | ORAL_TABLET | Freq: Once | ORAL | Status: AC
  Administered 2011-01-02: 40 mg via ORAL
  Filled 2011-01-02: qty 2

## 2011-01-02 MED ORDER — ONDANSETRON HCL 4 MG/2ML IJ SOLN: 4.0000 mg | INTRAMUSCULAR | Status: DC | PRN

## 2011-01-02 MED ORDER — PRENATAL PLUS 27-1 MG PO TABS
1.0000 | ORAL_TABLET | Freq: Every day | ORAL | Status: DC
  Administered 2011-01-02: 1 via ORAL

## 2011-01-02 MED ORDER — BUPIVACAINE HCL (PF) 0.25 % IJ SOLN
INTRAMUSCULAR | Status: DC | PRN
  Administered 2011-01-02: 10 mL

## 2011-01-02 MED ORDER — SIMETHICONE 80 MG PO CHEW: 80.0000 mg | CHEWABLE_TABLET | ORAL | Status: DC | PRN

## 2011-01-02 MED ORDER — ONDANSETRON HCL 4 MG PO TABS
4.0000 mg | ORAL_TABLET | ORAL | Status: DC | PRN
Start: 2011-01-02 — End: 2011-01-03

## 2011-01-02 MED ORDER — SENNOSIDES-DOCUSATE SODIUM 8.6-50 MG PO TABS
2.0000 | ORAL_TABLET | Freq: Every day | ORAL | Status: DC
  Administered 2011-01-02: 2 via ORAL

## 2011-01-02 MED ORDER — DIBUCAINE 1 % RE OINT: 1.0000 "application " | TOPICAL_OINTMENT | RECTAL | Status: DC | PRN

## 2011-01-02 MED ORDER — BENZOCAINE-MENTHOL 20-0.5 % EX AERO: 1.0000 "application " | INHALATION_SPRAY | CUTANEOUS | Status: DC | PRN

## 2011-01-02 MED ORDER — MEASLES, MUMPS & RUBELLA VAC ~~LOC~~ INJ
0.5000 mL | INJECTION | Freq: Once | SUBCUTANEOUS | Status: DC
  Filled 2011-01-02: qty 0.5

## 2011-01-02 MED ORDER — SODIUM BICARBONATE 8.4 % IV SOLN
INTRAVENOUS | Status: DC | PRN
  Administered 2011-01-02: 5 mL via EPIDURAL

## 2011-01-02 MED ORDER — IBUPROFEN 600 MG PO TABS
600.0000 mg | ORAL_TABLET | Freq: Four times a day (QID) | ORAL | Status: DC
  Administered 2011-01-02 – 2011-01-03 (×5): 600 mg via ORAL
  Filled 2011-01-02: qty 1
  Filled 2011-01-02: qty 12
  Filled 2011-01-02 (×2): qty 1
  Filled 2011-01-02 (×2): qty 12
  Filled 2011-01-02 (×2): qty 1
  Filled 2011-01-02: qty 12
  Filled 2011-01-02: qty 1

## 2011-01-02 MED ORDER — METOCLOPRAMIDE HCL 10 MG PO TABS
10.0000 mg | ORAL_TABLET | Freq: Once | ORAL | Status: AC
  Administered 2011-01-02: 10 mg via ORAL
  Filled 2011-01-02: qty 1

## 2011-01-02 MED ORDER — MEDROXYPROGESTERONE ACETATE 150 MG/ML IM SUSP: 150.0000 mg | INTRAMUSCULAR | Status: DC | PRN

## 2011-01-02 MED ORDER — PRENATAL PLUS 27-1 MG PO TABS
1.0000 | ORAL_TABLET | Freq: Every day | ORAL | Status: DC
  Filled 2011-01-02: qty 1

## 2011-01-02 SURGICAL SUPPLY — 28 items
BENZOIN TINCTURE PRP APPL 2/3 (GAUZE/BANDAGES/DRESSINGS) ×2 IMPLANT
CHLORAPREP W/TINT 26ML (MISCELLANEOUS) ×2 IMPLANT
CLOTH BEACON ORANGE TIMEOUT ST (SAFETY) ×2 IMPLANT
CONTAINER PREFILL 10% NBF 15ML (MISCELLANEOUS) ×4 IMPLANT
DERMABOND ADHESIVE PROPEN (GAUZE/BANDAGES/DRESSINGS)
DERMABOND ADVANCED (GAUZE/BANDAGES/DRESSINGS) ×1
DERMABOND ADVANCED .7 DNX12 (GAUZE/BANDAGES/DRESSINGS) ×1 IMPLANT
DERMABOND ADVANCED .7 DNX6 (GAUZE/BANDAGES/DRESSINGS) IMPLANT
DRAPE UTILITY XL STRL (DRAPES) ×2 IMPLANT
ELECT REM PT RETURN 9FT ADLT (ELECTROSURGICAL) ×2
ELECTRODE REM PT RTRN 9FT ADLT (ELECTROSURGICAL) ×1 IMPLANT
GLOVE BIOGEL PI IND STRL 7.0 (GLOVE) ×2 IMPLANT
GLOVE BIOGEL PI INDICATOR 7.0 (GLOVE) ×2
GLOVE ECLIPSE 6.5 STRL STRAW (GLOVE) ×2 IMPLANT
GOWN PREVENTION PLUS LG XLONG (DISPOSABLE) ×4 IMPLANT
NEEDLE HYPO 25X1 1.5 SAFETY (NEEDLE) ×2 IMPLANT
NS IRRIG 1000ML POUR BTL (IV SOLUTION) ×2 IMPLANT
PACK ABDOMINAL MINOR (CUSTOM PROCEDURE TRAY) ×2 IMPLANT
PENCIL BUTTON HOLSTER BLD 10FT (ELECTRODE) ×2 IMPLANT
SPONGE LAP 4X18 X RAY DECT (DISPOSABLE) IMPLANT
STRIP CLOSURE SKIN 1/4X3 (GAUZE/BANDAGES/DRESSINGS) ×2 IMPLANT
SUT CHROMIC GUT AB #0 18 (SUTURE) ×2 IMPLANT
SUT MNCRL AB 4-0 PS2 18 (SUTURE) ×2 IMPLANT
SUT VICRYL 0 UR6 27IN ABS (SUTURE) ×2 IMPLANT
SYR CONTROL 10ML LL (SYRINGE) ×2 IMPLANT
TOWEL OR 17X24 6PK STRL BLUE (TOWEL DISPOSABLE) ×4 IMPLANT
TRAY FOLEY CATH 14FR (SET/KITS/TRAYS/PACK) ×2 IMPLANT
WATER STERILE IRR 1000ML POUR (IV SOLUTION) ×2 IMPLANT

## 2011-01-02 NOTE — Op Note (Signed)
Preop diagnosis: Desire for sterilization  Postop diagnosis: Same  Anesthesia: Epidural  Anesthesiologist: Dr. Kyle Jackson  Procedure: Postpartum bilateral tubal ligation  Surgeon: Dr. Queenie Aufiero  Asst.: None  Estimated blood loss: Minimal  Procedure:  After being informed of the planned procedure with possible complications, including bleeding, infection, injury to other organs, irreversibility of tubal ligation and failure rate of 1 in 500,  informed consent is obtained and patient is taken to OR 4.  She is given epidural anesthesia without any complication with the previously placed epidural catheter. She is then placed in  dorsal decubitus position, prepped and draped in a sterile fashion. A Foley catheter is inserted.  After assessing adequate level of anesthesia, we infiltrate the umbilical area using  10 cc of Marcaine 0.25. We then perform a semi-elliptical incision which was brought down bluntly to the fascia. The fascia is identified and grasped with Coker forceps and incised with Mayo scissors. Peritoneum is entered bluntly. Using Trendelenburg position, moist packing and long retractors, we are able to identify the left tube which was then grasped with  Babcock forceps and exteriorized until the fimbria is visualized. We enter the mesosalpinx with cauterization. We then doubly ligate the proximal stump and the distal stump with 0 chromic. A section of 1.5 cm of isthmo-ampullary  tube is then resected. Both stumps are cauterized and hemostasis is adequate. We proceed in the exact same fashion on the right side again after having  visualized the the fimbriae.  All retractors and sponges are removed. The fascia is closed with a running suture of 0 Vicryl. The skin is closed with a subcuticular suture of 3-0 Monocryl and Dermabond.  Instrument and sponge count was complete x2. Estimated blood loss is minimal. The procedure is well tolerated by the patient who is taken to recovery room  in a well and stable condition.  Specimen: 2 segments of tube sent to pathology.  

## 2011-01-02 NOTE — Addendum Note (Signed)
Addendum  created 01/02/11 1640 by Doreene Burke   Modules edited:Notes Section

## 2011-01-02 NOTE — Progress Notes (Signed)
Post Partum Day 1 Subjective:  Well. Lochia are normal. Voiding, ambulating, NPO for tubal procedure   Objective: Blood pressure 96/65, pulse 77, temperature 98.5 F (36.9 C), temperature source Oral, resp. rate 20, height 5\' 4"  (1.626 m), weight 61.78 kg (136 lb 3.2 oz), last menstrual period 05/06/2010, SpO2 98.00%, unknown if currently breastfeeding.  Physical Exam:  General: normal Lochia: appropriate Uterine Fundus: 0/1 firm non-tender  Extremities: No evidence of DVT seen on physical exam.     Basename 01/02/11 0517 12/31/10 1836  HGB 8.6* 10.1*  HCT 27.4* 32.2*    Assessment/Plan: Normal Post-partum. Continue routine post-partum care. Desires tubal ligation. Risks and benefits reviewed. Consent signed. Anticipate discharge tomorrow    LOS: 2 days   Haley Shepherd A MD 01/02/2011, 10:23 AM

## 2011-01-02 NOTE — Transfer of Care (Signed)
Immediate Anesthesia Transfer of Care Note  Patient: Haley Shepherd  Procedure(s) Performed:  POST PARTUM TUBAL LIGATION  Patient Location: PACU  Anesthesia Type: Epidural  Level of Consciousness: alert  and oriented  Airway & Oxygen Therapy: Patient Spontanous Breathing  Post-op Assessment: Report given to PACU RN and Post -op Vital signs reviewed and stable  Post vital signs: stable  Complications: No apparent anesthesia complications

## 2011-01-02 NOTE — Anesthesia Postprocedure Evaluation (Signed)
Anesthesia Post Note  Patient: Haley Shepherd  Procedure(s) Performed:  POST PARTUM TUBAL LIGATION  Anesthesia type: Epidural  Patient location: Mother/Baby  Post pain: Pain level controlled  Post assessment: Post-op Vital signs reviewed  Last Vitals:  Filed Vitals:   01/02/11 1433  BP: 124/82  Pulse: 70  Temp: 36.3 C  Resp: 16    Post vital signs: Reviewed  Level of consciousness: awake  Complications: No apparent anesthesia complications

## 2011-01-02 NOTE — Anesthesia Postprocedure Evaluation (Signed)
  Anesthesia Post-op Note  Patient: Haley Shepherd  Procedure(s) Performed:  POST PARTUM TUBAL LIGATION   Patient is awake, responsive, moving her legs, and has signs of resolution of her numbness. Pain and nausea are reasonably well controlled. Vital signs are stable and clinically acceptable. Oxygen saturation is clinically acceptable. There are no apparent anesthetic complications at this time. Patient is ready for discharge.

## 2011-01-03 DIAGNOSIS — Z9851 Tubal ligation status: Secondary | ICD-10-CM

## 2011-01-03 DIAGNOSIS — IMO0001 Reserved for inherently not codable concepts without codable children: Secondary | ICD-10-CM

## 2011-01-03 DIAGNOSIS — D649 Anemia, unspecified: Secondary | ICD-10-CM | POA: Diagnosis present

## 2011-01-03 MED ORDER — OXYCODONE-ACETAMINOPHEN 5-325 MG PO TABS: 1.0000 | ORAL_TABLET | ORAL | Status: AC | PRN

## 2011-01-03 MED ORDER — IBUPROFEN 600 MG PO TABS: 600.0000 mg | ORAL_TABLET | Freq: Four times a day (QID) | ORAL | Status: AC | PRN

## 2011-01-03 NOTE — Progress Notes (Signed)
UR Chart review completed.  
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
## 2011-01-03 NOTE — Discharge Summary (Signed)
Obstetric Discharge Summary Reason for Admission: Twins, 35 + weeks, advanced cervical dilation, Positive GBS Prenatal Procedures: NST and ultrasound Intrapartum Procedures: spontaneous vaginal delivery of twins, external version of Twin B Postpartum Procedures: P.P. tubal ligation Complications-Operative and Postpartum: none  Hospital course:  Patient admitted on 12/31/10 from the office for advanced cervical dilation in a multip with twins.  Cervix was 4-5 cm, thin, with BBOW.  She was monitored overnight, with little change in contraction patter, but slight change in cervix to 6 cm by Dr. Debria Garret exam.  Patient and her husband requested active intervention to facilitate labor.  GBS prophylaxis, AROM was performed, and pitocin were initiated, and the patient progressed rapidly to vaginal delivery of her twins.  See delivery notes for further information.  No lacerations were noted, and both babies were maintained with the mom.  On day 1, patient had a tubal ligation under existing epidural anesthesia, and this was tolerated without complications.  Her physical exam remained normal throughout her stay. Hgb on day 1 was 8.6, down from 10.1, but patient was having no syncope or dizziness.   The rest of her hospital course was uncomplicated, and the patient was officially discharged home on 01/03/11.  The infants were going to be made "baby patients", due to low weight and to ensure adequate feeding status.   Discharge Diagnoses: Preterm delivery of twins, postpartum bilateral tubal ligation  Discharge Information: Date: 01/03/2011 Activity: Per [facility] handout Diet: routine Medications: Ibuprofen and Percocet, continue FE and PNV Condition: stable Instructions: refer to practice specific booklet Discharge to: home Follow-up Information    Follow up with [facility] in 6 weeks.         Newborn Data:   Karia, Ehresman [[id number]]  Live born female  Birth Weight: 4 lb 9.2 oz  (2075 g) APGAR: 9, [address]   Maraki, Macquarrie [[id number]]  Live born female  Birth Weight: 5 lb 3.1 oz (2355 g) APGAR: 8, 9  Babies remained as inpatients on mom's day of discharge, due to low weight.  Anticipate their d/c in next 1-2 days.  Nigel Bridgeman 01/03/2011, 10:33 AM

## 2011-01-03 NOTE — Progress Notes (Addendum)
Post Partum Day 2 Subjective: no complaints.  Infants may be remaining as patients due to preterm status, but overall doing well.  Patient using po meds for pain.  Objective: Blood pressure 132/87, pulse 67, temperature 97.8 F (36.6 C), temperature source Oral, resp. rate 18, height 5\' 4"  (1.626 m), weight 61.78 kg (136 lb 3.2 oz), last menstrual period 05/06/2010, SpO2 99.00%, unknown if currently breastfeeding.  Physical Exam:  General: alert Lochia: appropriate Uterine Fundus: firm Incision: healing well DVT Evaluation: No evidence of DVT seen on physical exam. Negative Homan's sign.   Basename 01/02/11 0517 12/31/10 1836  HGB 8.6* 10.1*  HCT 27.4* 32.2*    Assessment/Plan: Discharge home.   Will await peds decision regarding infants' status. Rx Motrin, Percocet. [facility] 6 weeks [facility] or prn.   LOS: 3 days   LATHAM, Chip Boer 01/03/2011, 9:16 AM    Agree with above - AYR

## 2011-01-06 ENCOUNTER — Encounter (HOSPITAL_COMMUNITY): Payer: Self-pay | Admitting: Obstetrics and Gynecology

## 2011-01-09 ENCOUNTER — Encounter (HOSPITAL_COMMUNITY): Payer: Self-pay | Admitting: Obstetrics and Gynecology

## 2011-01-09 ENCOUNTER — Encounter (HOSPITAL_COMMUNITY): Payer: Self-pay

## 2012-03-03 ENCOUNTER — Emergency Department: Payer: Self-pay | Admitting: Emergency Medicine

## 2012-06-21 ENCOUNTER — Emergency Department: Payer: Self-pay | Admitting: Emergency Medicine

## 2012-09-29 ENCOUNTER — Emergency Department: Payer: Self-pay | Admitting: Emergency Medicine

## 2012-09-30 ENCOUNTER — Emergency Department: Payer: Self-pay

## 2012-09-30 LAB — URINALYSIS, COMPLETE
Blood: NEGATIVE
Glucose,UR: NEGATIVE mg/dL (ref 0–75)
RBC,UR: 19 /HPF (ref 0–5)
Squamous Epithelial: 4
WBC UR: 14 /HPF (ref 0–5)

## 2012-09-30 LAB — BASIC METABOLIC PANEL
Anion Gap: 3 — ABNORMAL LOW (ref 7–16)
BUN: 16 mg/dL (ref 7–18)
Calcium, Total: 9.5 mg/dL (ref 8.5–10.1)
Chloride: 104 mmol/L (ref 98–107)
Creatinine: 0.72 mg/dL (ref 0.60–1.30)
EGFR (African American): >60
EGFR (Non-African Amer.): >60
Glucose: 94 mg/dL (ref 65–99)
Osmolality: 275 (ref 275–301)
Sodium: 137 mmol/L (ref 136–145)

## 2012-09-30 LAB — CBC
HCT: 40.7 % (ref 35.0–47.0)
HGB: 13.9 g/dL (ref 12.0–16.0)
MCH: 29.5 pg (ref 26.0–34.0)
MCHC: 34.1 g/dL (ref 32.0–36.0)
MCV: 87 fL (ref 80–100)
Platelet: 254 10*3/uL (ref 150–440)
RBC: 4.7 10*6/uL (ref 3.80–5.20)
WBC: 8 10*3/uL (ref 3.6–11.0)

## 2013-03-05 ENCOUNTER — Encounter (HOSPITAL_COMMUNITY): Payer: Self-pay | Admitting: *Deleted

## 2013-03-05 ENCOUNTER — Inpatient Hospital Stay (HOSPITAL_COMMUNITY)
Admission: AD | Admit: 2013-03-05 | Discharge: 2013-03-05 | Disposition: A | Discharge status: Home / Self Care | Payer: Self-pay | Source: Ambulatory Visit | Attending: Obstetrics & Gynecology | Admitting: Obstetrics & Gynecology

## 2013-03-05 DIAGNOSIS — Z87891 Personal history of nicotine dependence: Secondary | ICD-10-CM | POA: Insufficient documentation

## 2013-03-05 DIAGNOSIS — S29012A Strain of muscle and tendon of back wall of thorax, initial encounter: Secondary | ICD-10-CM

## 2013-03-05 DIAGNOSIS — M545 Low back pain, unspecified: Secondary | ICD-10-CM | POA: Insufficient documentation

## 2013-03-05 DIAGNOSIS — M549 Dorsalgia, unspecified: Secondary | ICD-10-CM

## 2013-03-05 LAB — COMPREHENSIVE METABOLIC PANEL
ALK PHOS: 67 U/L (ref 39–117)
ALT: 16 U/L (ref 0–35)
AST: 21 U/L (ref 0–37)
Albumin: 3.4 g/dL — ABNORMAL LOW (ref 3.5–5.2)
BILIRUBIN TOTAL: 0.2 mg/dL — AB (ref 0.3–1.2)
BUN: 15 mg/dL (ref 6–23)
CO2: 27 meq/L (ref 19–32)
Calcium: 9.1 mg/dL (ref 8.4–10.5)
Chloride: 106 mEq/L (ref 96–112)
Creatinine, Ser: 0.58 mg/dL (ref 0.50–1.10)
GLUCOSE: 87 mg/dL (ref 70–99)
POTASSIUM: 4.1 meq/L (ref 3.7–5.3)
Sodium: 143 mEq/L (ref 137–147)
Total Protein: 7.3 g/dL (ref 6.0–8.3)

## 2013-03-05 LAB — URINALYSIS, ROUTINE W REFLEX MICROSCOPIC
Bilirubin Urine: NEGATIVE
GLUCOSE, UA: NEGATIVE mg/dL
Ketones, ur: NEGATIVE mg/dL
Nitrite: NEGATIVE
PH: 6 (ref 5.0–8.0)
Protein, ur: NEGATIVE mg/dL
Urobilinogen, UA: 0.2 mg/dL (ref 0.0–1.0)

## 2013-03-05 LAB — URINE MICROSCOPIC-ADD ON

## 2013-03-05 MED ORDER — CYCLOBENZAPRINE HCL 10 MG PO TABS
10.0000 mg | ORAL_TABLET | Freq: Once | ORAL | Status: AC
Start: 2013-03-05 — End: 2013-03-05
  Administered 2013-03-05: 10 mg via ORAL
  Filled 2013-03-05: qty 1

## 2013-03-05 MED ORDER — CYCLOBENZAPRINE HCL 5 MG PO TABS: 10.0000 mg | ORAL_TABLET | Freq: Two times a day (BID) | ORAL | Status: DC | PRN

## 2013-03-05 NOTE — MAU Note (Signed)
Patient presents with complaint of back pain since last night.

## 2013-03-05 NOTE — MAU Provider Note (Signed)
History     CSN: [id number]  Arrival date and time: 03/05/13 3007   First Provider Initiated Contact with Patient 03/05/13 [phone]      Chief Complaint  Patient presents with  . Back Pain   HPI  Ms. Haley Shepherd is a 33 y.o. female [phone] who presents with lower back pain that started last night; L>R.  Pt has not taken anything for the pain. Denies injury to the back that she knows of.  Pt is unable to bend forward or backward because the pain hurts to bad. Patients boyfriend is present and states that her back pain started following intercourse; "everything was fine and then after sex the back pain started".  She feels like she may have had her back arched during sex and pulled a muscle. Pt declines STI testing at this time.  Pt says the pain actually started mildly 3 days ago however became worse following intercourse last night.   OB History   Grav Para Term Preterm Abortions TAB SAB Ect Mult Living   7 6 5 1 1 1  0  1 7      Past Medical History  Diagnosis Date  . Bartholin cyst   . Depression     postpartum  . Infection     HSV    Past Surgical History  Procedure Laterality Date  . Bartholin gland cyst excision    . Tubal ligation  01/02/2011    Procedure: POST PARTUM TUBAL LIGATION;  Surgeon: Alwyn Pea, MD;  Location: [facility];  Service: Gynecology;  Laterality: Bilateral;  . Vaginal delivery  01/01/2011    Procedure: VAGINAL DELIVERY;  Surgeon: Eli Hose, MD;  Location: [facility];  Service: Gynecology;  Laterality: N/A;    Family History  Problem Relation Age of Onset  . Asthma Mother   . Asthma Sister   . Asthma Son     History  Substance Use Topics  . Smoking status: Former Research scientist (life sciences)  . Smokeless tobacco: Never Used  . Alcohol Use: No    Allergies: No Known Allergies  Prescriptions prior to admission  Medication Sig Dispense Refill  . ibuprofen (ADVIL,MOTRIN) 200 MG tablet Take 400 mg by mouth every 6 (six) hours as needed for mild pain or  moderate pain.      . Liniments (BEN GAY EX) Apply 1 application topically once.      Marland Kitchen OVER THE COUNTER MEDICATION Take 1 tablet by mouth daily as needed (allergies). Store brand allergy medication.       Results for orders placed during the hospital encounter of 03/05/13 (from the past 48 hour(s))  URINALYSIS, ROUTINE W REFLEX MICROSCOPIC     Status: Abnormal   Collection Time    03/05/13  9:08 AM      Result Value Range   Color, Urine YELLOW  YELLOW   APPearance CLEAR  CLEAR   Specific Gravity, Urine >1.030 (*) 1.005 - 1.030   pH 6.0  5.0 - 8.0   Glucose, UA NEGATIVE  NEGATIVE mg/dL   Hgb urine dipstick SMALL (*) NEGATIVE   Bilirubin Urine NEGATIVE  NEGATIVE   Ketones, ur NEGATIVE  NEGATIVE mg/dL   Protein, ur NEGATIVE  NEGATIVE mg/dL   Urobilinogen, UA 0.2  0.0 - 1.0 mg/dL   Nitrite NEGATIVE  NEGATIVE   Leukocytes, UA SMALL (*) NEGATIVE  URINE MICROSCOPIC-ADD ON     Status: Abnormal   Collection Time    03/05/13  9:08 AM  Result Value Range   Squamous Epithelial / LPF FEW (*) RARE   WBC, UA 7-10  <3 WBC/hpf   RBC / HPF 0-2  <3 RBC/hpf   Bacteria, UA FEW (*) RARE  COMPREHENSIVE METABOLIC PANEL     Status: Abnormal   Collection Time    03/05/13  9:48 AM      Result Value Range   Sodium 143  137 - 147 mEq/L   Potassium 4.1  3.7 - 5.3 mEq/L   Chloride 106  96 - 112 mEq/L   CO2 27  19 - 32 mEq/L   Glucose, Bld 87  70 - 99 mg/dL   BUN 15  6 - 23 mg/dL   Creatinine, Ser 0.58  0.50 - 1.10 mg/dL   Calcium 9.1  8.4 - 10.5 mg/dL   Total Protein 7.3  6.0 - 8.3 g/dL   Albumin 3.4 (*) 3.5 - 5.2 g/dL   AST 21  0 - 37 U/L   ALT 16  0 - 35 U/L   Alkaline Phosphatase 67  39 - 117 U/L   Total Bilirubin 0.2 (*) 0.3 - 1.2 mg/dL   GFR calc non Af Amer >90  >90 mL/min   GFR calc Af Amer >90  >90 mL/min   Comment: (NOTE)     The eGFR has been calculated using the CKD EPI equation.     This calculation has not been validated in all clinical situations.     eGFR's persistently <90  mL/min signify possible Chronic Kidney     Disease.     Review of Systems  Constitutional: Negative for fever and chills.  Gastrointestinal: Negative for nausea, vomiting and abdominal pain.  Genitourinary: Negative for dysuria, urgency, frequency, hematuria and flank pain.  Musculoskeletal: Positive for back pain.       Left side > Right side.    Physical Exam   Blood pressure 107/59, pulse 60, temperature 97.9 F (36.6 C), temperature source Oral, resp. rate 16, height 5' 5"  (1.651 m), weight 42.638 kg (94 lb), last menstrual period 02/24/2013.  Physical Exam  Constitutional: She is oriented to person, place, and time. She appears well-developed and well-nourished. No distress.  Eyes: Pupils are equal, round, and reactive to light.  Neck: Neck supple.  GI: Soft. She exhibits no distension and no mass. There is no tenderness. There is CVA tenderness. There is no rebound and no guarding.  Musculoskeletal: Normal range of motion.       Lumbar back: She exhibits tenderness and pain. She exhibits normal range of motion, no bony tenderness, no swelling, no edema and no spasm.       Back:       Arms: + left upper and lower tenderness on palpation; mild.   Neurological: She is alert and oriented to person, place, and time. Coordination normal.  Skin: Skin is warm. She is not diaphoretic.  Psychiatric: Her behavior is normal.    MAU Course  Procedures None  MDM UA CMET- normal kidney function  Flexeril 10 mg PO times 1 dose  Urine shows small leukocytes, small Hgb; pt will no symptoms of UTI. Culture sent; will call patient with results if positive, will hold off on antibiotic treatment at this time and treat her back pain as muscle strain.  Patient feels some relief from flexeril.  Assessment and Plan   A:  1. Muscle strain of left upper back   2. Back pain, acute     P:  Discharge home  in stable condition Alternate heat-cold on injury RX: flexeril Ok to take  ibuprofen as needed, as directed on the bottle Pt instructed to go to [facility] or [facility] or Urgent care if back pain persists past 1-2 weeks   Darrelyn Hillock Romero Letizia, NP  03/05/2013, 10:43 AM

## 2013-03-05 NOTE — Discharge Instructions (Signed)
Back Injury Prevention The following tips can help you to prevent a back injury. PHYSICAL FITNESS  Exercise often. Try to develop strong stomach (abdominal) muscles.  Do aerobic exercises often. This includes walking, jogging, biking, swimming.  Do exercises that help with balance and strength often. This includes tai chi and yoga.  Stretch before and after you exercise.  Keep a healthy weight. DIET   Ask your doctor how much calcium and vitamin D you need every day.  Include calcium in your diet. Foods high in calcium include dairy products; green, leafy vegetables; and products with calcium added (fortified).  Include vitamin D in your diet. Foods high in vitamin D include milk and products with vitamin D added.  Think about taking a multivitamin or other nutritional products called " supplements."  Stop smoking if you smoke. POSTURE   Sit and stand up straight. Avoid leaning forward or hunching over.  Choose chairs that support your lower back.  If you work at a desk:  Sit close to your work so you do not lean over.  Keep your chin tucked in.  Keep your neck drawn back.  Keep your elbows bent at a right angle. Your arms should look like the letter "L."  Sit high and close to the steering wheel when you drive. Add low back support to your car seat if needed.  Avoid sitting or standing in one position for too long. Get up and move around every hour. Take breaks if you are driving for a long time.  Sleep on your side with your knees slightly bent. You can also sleep on your back with a pillow under your knees. Do not sleep on your stomach. LIFTING, TWISTING, AND REACHING  Avoid heavy lifting, especially lifting over and over again. If you must do heavy lifting:  Stretch before lifting.  Work slowly.  Rest between lifts.  Use carts and dollies to move objects when possible.  Make several small trips instead of carrying 1 heavy load.  Ask for help when you  need it.  Ask for help when moving big, awkward objects.  Follow these steps when lifting:  Stand with your feet shoulder-width apart.  Get as close to the object as you can. Do not pick up heavy objects that are far from your body.  Use handles or lifting straps when possible.  Bend at your knees. Squat down, but keep your heels off the floor.  Keep your shoulders back, your chin tucked in, and your back straight.  Lift the object slowly. Tighten the muscles in your legs, stomach, and butt. Keep the object as close to the center of your body as possible.  Reverse these directions when you put a load down.  Do not:  Lift the object above your waist.  Twist at the waist while lifting or carrying a load. Move your feet if you need to turn, not your waist.  Bend over without bending at your knees.  Avoid reaching over your head, across a table, or for an object on a high surface. OTHER TIPS  Avoid wet floors and keep sidewalks clear of ice.  Do not sleep on a mattress that is too soft or too hard.  Keep items that you use often within easy reach.  Put heavier objects on shelves at waist level. Put lighter objects on lower or higher shelves.  Find ways to lessen your stress. You can try exercise, massage, or relaxation.  Get help for depression or anxiety if  Keep items that you use often within easy reach.  · Put heavier objects on shelves at waist level. Put lighter objects on lower or higher shelves.  · Find ways to lessen your stress. You can try exercise, massage, or relaxation.  · Get help for depression or anxiety if needed.  GET HELP IF:  · You injure your back.  · You have questions about diet, exercise, or other ways to prevent back injuries.  MAKE SURE YOU:  · Understand these instructions.  · Will watch your condition.  · Will get help right away if you are not doing well or get worse.  Document Released: 07/16/2007 Document Revised: 04/21/2011 Document Reviewed: 03/10/2011  ExitCare® Patient Information ©2014 [employer].

## 2013-03-06 LAB — URINE CULTURE: Colony Count: >=100000

## 2013-03-07 LAB — POCT PREGNANCY, URINE: PREG TEST UR: NEGATIVE

## 2013-09-16 ENCOUNTER — Encounter (HOSPITAL_COMMUNITY): Payer: Self-pay | Admitting: Emergency Medicine

## 2013-09-16 DIAGNOSIS — Z87891 Personal history of nicotine dependence: Secondary | ICD-10-CM | POA: Diagnosis not present

## 2013-09-16 DIAGNOSIS — R1084 Generalized abdominal pain: Secondary | ICD-10-CM | POA: Diagnosis not present

## 2013-09-16 DIAGNOSIS — R197 Diarrhea, unspecified: Secondary | ICD-10-CM | POA: Insufficient documentation

## 2013-09-16 DIAGNOSIS — Z8619 Personal history of other infectious and parasitic diseases: Secondary | ICD-10-CM | POA: Diagnosis not present

## 2013-09-16 DIAGNOSIS — Z8659 Personal history of other mental and behavioral disorders: Secondary | ICD-10-CM | POA: Diagnosis not present

## 2013-09-16 DIAGNOSIS — Z8742 Personal history of other diseases of the female genital tract: Secondary | ICD-10-CM | POA: Diagnosis not present

## 2013-09-16 NOTE — ED Notes (Addendum)
Pt presents with generalized abd pain, nausea, and diarrhea x1 week. Pt denies vomiting. [facility] is now

## 2013-09-17 ENCOUNTER — Emergency Department (HOSPITAL_COMMUNITY)
Admission: EM | Admit: 2013-09-17 | Discharge: 2013-09-17 | Discharge status: Against Medical Advice | Payer: Medicaid Other | Attending: Emergency Medicine | Admitting: Emergency Medicine

## 2013-09-17 DIAGNOSIS — R1084 Generalized abdominal pain: Secondary | ICD-10-CM

## 2013-09-17 DIAGNOSIS — R197 Diarrhea, unspecified: Secondary | ICD-10-CM

## 2013-09-17 LAB — CBC WITH DIFFERENTIAL/PLATELET
BASOS ABS: 0.1 10*3/uL (ref 0.0–0.1)
BASOS PCT: 1 % (ref 0–1)
Eosinophils Absolute: 0.3 10*3/uL (ref 0.0–0.7)
Eosinophils Relative: 4 % (ref 0–5)
HEMATOCRIT: 36.4 % (ref 36.0–46.0)
Hemoglobin: 12 g/dL (ref 12.0–15.0)
Lymphocytes Relative: 32 % (ref 12–46)
Lymphs Abs: 2.6 10*3/uL (ref 0.7–4.0)
MCH: 28.6 pg (ref 26.0–34.0)
MCHC: 33 g/dL (ref 30.0–36.0)
MCV: 86.9 fL (ref 78.0–100.0)
MONO ABS: 0.7 10*3/uL (ref 0.1–1.0)
Monocytes Relative: 9 % (ref 3–12)
Neutro Abs: 4.5 10*3/uL (ref 1.7–7.7)
Neutrophils Relative %: 54 % (ref 43–77)
Platelets: 267 10*3/uL (ref 150–400)
RBC: 4.19 MIL/uL (ref 3.87–5.11)
RDW: 12.6 % (ref 11.5–15.5)
WBC: 8.3 10*3/uL (ref 4.0–10.5)

## 2013-09-17 LAB — URINALYSIS, ROUTINE W REFLEX MICROSCOPIC
Bilirubin Urine: NEGATIVE
GLUCOSE, UA: NEGATIVE mg/dL
Ketones, ur: NEGATIVE mg/dL
Leukocytes, UA: NEGATIVE
Nitrite: NEGATIVE
PH: 7.5 (ref 5.0–8.0)
Protein, ur: NEGATIVE mg/dL
SPECIFIC GRAVITY, URINE: 1.015 (ref 1.005–1.030)
Urobilinogen, UA: 1 mg/dL (ref 0.0–1.0)

## 2013-09-17 LAB — COMPREHENSIVE METABOLIC PANEL
ALBUMIN: 3.4 g/dL — AB (ref 3.5–5.2)
ALT: 26 U/L (ref 0–35)
AST: 31 U/L (ref 0–37)
Alkaline Phosphatase: 73 U/L (ref 39–117)
Anion gap: 12 (ref 5–15)
BUN: 12 mg/dL (ref 6–23)
CALCIUM: 8.8 mg/dL (ref 8.4–10.5)
CO2: 24 mEq/L (ref 19–32)
CREATININE: 0.57 mg/dL (ref 0.50–1.10)
Chloride: 105 mEq/L (ref 96–112)
GFR calc Af Amer: >90 mL/min (ref >90)
GFR calc non Af Amer: >90 mL/min (ref >90)
Glucose, Bld: 87 mg/dL (ref 70–99)
Potassium: 3.7 mEq/L (ref 3.7–5.3)
Sodium: 141 mEq/L (ref 137–147)
TOTAL PROTEIN: 7 g/dL (ref 6.0–8.3)
Total Bilirubin: 0.2 mg/dL — ABNORMAL LOW (ref 0.3–1.2)

## 2013-09-17 LAB — URINE MICROSCOPIC-ADD ON

## 2013-09-17 LAB — LIPASE, BLOOD: LIPASE: 61 U/L — AB (ref 11–59)

## 2013-09-17 MED ORDER — ONDANSETRON 4 MG PO TBDP
4.0000 mg | ORAL_TABLET | Freq: Once | ORAL | Status: AC
  Administered 2013-09-17: 4 mg via ORAL
  Filled 2013-09-17: qty 1

## 2013-09-17 MED ORDER — LOPERAMIDE HCL 2 MG PO CAPS
4.0000 mg | ORAL_CAPSULE | Freq: Once | ORAL | Status: AC
  Administered 2013-09-17: 4 mg via ORAL
  Filled 2013-09-17: qty 2

## 2013-09-17 MED ORDER — OXYCODONE-ACETAMINOPHEN 5-325 MG PO TABS
1.0000 | ORAL_TABLET | Freq: Once | ORAL | Status: DC
  Filled 2013-09-17: qty 1

## 2013-09-17 NOTE — ED Notes (Signed)
This RN went to assess patient at this time. Patient was not in room and the blood pressure cuff/pulse ox was laying in the bed. No other staff member witnessed patient leave- patient is no where to be found. Patient was with a friend during 0218 rounding and did not states anything about leaving.  Dr. Wilkie AyeHorton made aware of patient leaving AMA at this time.

## 2013-09-17 NOTE — ED Provider Notes (Signed)
CSN: [id number]     Arrival date & time 09/16/13  2326 History   First MD Initiated Contact with Patient 09/17/13 0020     Chief Complaint  Patient presents with  . Abdominal Pain     (Consider location/radiation/quality/duration/timing/severity/associated sxs/prior Treatment) HPI  This is a 33 year old female who presents with a one-week history of abdominal pain and diarrhea.  Patient reports diffuse abdominal pain for the last week. It comes and goes. There are no exacerbating or alleviating factors. She reports nonbloody diarrhea. No nausea or vomiting. She has tolerated p.o. No known fevers. She states that her pain right now 7/10. She's not taken anything for the pain today. She did take Pepto-Bismol several days ago which seemed to help some. Her children have had similar illnesses but they have been self-limited.  Past Medical History  Diagnosis Date  . Bartholin cyst   . Depression     postpartum  . Infection     HSV   Past Surgical History  Procedure Laterality Date  . Bartholin gland cyst excision    . Tubal ligation  01/02/2011    Procedure: POST PARTUM TUBAL LIGATION;  Surgeon: Esmeralda Arthur, MD;  Location: [facility];  Service: Gynecology;  Laterality: Bilateral;  . Vaginal delivery  01/01/2011    Procedure: VAGINAL DELIVERY;  Surgeon: Janine Limbo, MD;  Location: [facility];  Service: Gynecology;  Laterality: N/A;   Family History  Problem Relation Age of Onset  . Asthma Mother   . Asthma Sister   . Asthma Son    History  Substance Use Topics  . Smoking status: Former Games developer  . Smokeless tobacco: Never Used  . Alcohol Use: No   OB History   Grav Para Term Preterm Abortions TAB SAB Ect Mult Living   7 6 5 1 1 1  0  1 7     Review of Systems  Constitutional: Negative for fever.  Respiratory: Negative for cough, chest tightness and shortness of breath.   Cardiovascular: Negative for chest pain.  Gastrointestinal: Positive for abdominal pain and diarrhea.  Negative for nausea, vomiting and blood in stool.  Genitourinary: Negative for dysuria and vaginal bleeding.  Skin: Negative for rash.  All other systems reviewed and are negative.     Allergies  Review of patient's allergies indicates no known allergies.  Home Medications   Prior to Admission medications   Medication Sig Start Date End Date Taking? Authorizing Provider  ibuprofen (ADVIL,MOTRIN) 200 MG tablet Take 400 mg by mouth every 6 (six) hours as needed for mild pain or moderate pain.   Yes Historical Provider, MD   BP 95/61  Pulse 74  Temp(Src) 98.4 F (36.9 C) (Oral)  Resp 16  Ht 5\' 4"  (1.626 m)  SpO2 100%  LMP 09/16/2013 Physical Exam  Nursing note and vitals reviewed. Constitutional: She is oriented to person, place, and time. She appears well-developed and well-nourished. No distress.  HENT:  Head: Normocephalic and atraumatic.  Mouth/Throat: Oropharynx is clear and moist.  Cardiovascular: Normal rate, regular rhythm and normal heart sounds.   No murmur heard. Pulmonary/Chest: Effort normal and breath sounds normal. No respiratory distress. She has no wheezes.  Abdominal: Soft. Bowel sounds are normal. There is no tenderness. There is no rebound and no guarding.  Neurological: She is alert and oriented to person, place, and time.  Skin: Skin is warm and dry.  Psychiatric: She has a normal mood and affect.    ED Course  Procedures (including  critical care time) Labs Review Labs Reviewed  COMPREHENSIVE METABOLIC PANEL - Abnormal; Notable for the following:    Albumin 3.4 (*)    Total Bilirubin 0.2 (*)    All other components within normal limits  URINALYSIS, ROUTINE W REFLEX MICROSCOPIC - Abnormal; Notable for the following:    Hgb urine dipstick MODERATE (*)    All other components within normal limits  LIPASE, BLOOD - Abnormal; Notable for the following:    Lipase 61 (*)    All other components within normal limits  CBC WITH DIFFERENTIAL  URINE  MICROSCOPIC-ADD ON  POC URINE PREG, ED    Imaging Review No results found.   EKG Interpretation None     Medications  loperamide (IMODIUM) capsule 4 mg (4 mg Oral Given 09/17/13 0044)  ondansetron (ZOFRAN-ODT) disintegrating tablet 4 mg (4 mg Oral Given 09/17/13 0043)    MDM   Final diagnoses:  Generalized abdominal pain  Diarrhea    Patient presents with diffuse abdominal pain and diarrhea. Positive sick contacts. She is nontoxic on exam. No signs of peritonitis. Patient was given Imodium and Zofran. Workup is reassuring. Patient was able to tolerate fluids. Suspect viral syndrome given positive sick contacts. Prior to formal discharge, patient eloped without discharge instructions.    Shon Batonourtney F Horton, MD 09/17/13 [phone]

## 2013-12-12 ENCOUNTER — Encounter (HOSPITAL_COMMUNITY): Payer: Self-pay | Admitting: Emergency Medicine

## 2014-09-06 ENCOUNTER — Emergency Department
Admission: EM | Admit: 2014-09-06 | Discharge: 2014-09-06 | Disposition: A | Discharge status: Home / Self Care | Payer: Medicaid Other | Attending: Emergency Medicine | Admitting: Emergency Medicine

## 2014-09-06 ENCOUNTER — Encounter: Payer: Self-pay | Admitting: Emergency Medicine

## 2014-09-06 DIAGNOSIS — H0013 Chalazion right eye, unspecified eyelid: Secondary | ICD-10-CM | POA: Insufficient documentation

## 2014-09-06 DIAGNOSIS — Z720 Tobacco use: Secondary | ICD-10-CM | POA: Insufficient documentation

## 2014-09-06 MED ORDER — GENTAMICIN SULFATE 0.3 % OP SOLN
1.0000 [drp] | Freq: Three times a day (TID) | OPHTHALMIC | Status: DC
Start: 2014-09-06 — End: 2019-06-13

## 2014-09-06 NOTE — Discharge Instructions (Signed)
Chalazion A chalazion is a swelling or hard lump on the eyelid caused by a blocked oil gland. Chalazions may occur on the upper or the lower eyelid.  CAUSES  Oil gland in the eyelid becomes blocked. SYMPTOMS   Swelling or hard lump on the eyelid. This lump may make it hard to see out of the eye.  The swelling may spread to areas around the eye. TREATMENT   Although some chalazions disappear by themselves in 1 or 2 months, some chalazions may need to be removed.  Medicines to treat an infection may be required. HOME CARE INSTRUCTIONS   Wash your hands often and dry them with a clean towel. Do not touch the chalazion.  Apply heat to the eyelid several times a day for 10 minutes to help ease discomfort and bring any yellowish white fluid (pus) to the surface. One way to apply heat to a chalazion is to use the handle of a metal spoon.  Hold the handle under hot water until it is hot, and then wrap the handle in paper towels so that the heat can come through without burning your skin.  Hold the wrapped handle against the chalazion and reheat the spoon handle as needed.  Apply heat in this fashion for 10 minutes, 4 times per day.  Return to your caregiver to have the pus removed if it does not break (rupture) on its own.  Do not try to remove the pus yourself by squeezing the chalazion or sticking it with a pin or needle.  Only take over-the-counter or prescription medicines for pain, discomfort, or fever as directed by your caregiver. SEEK IMMEDIATE MEDICAL CARE IF:   You have pain in your eye.  Your vision changes.  The chalazion does not go away.  The chalazion becomes painful, red, or swollen, grows larger, or does not start to disappear after 2 weeks. MAKE SURE YOU:   Understand these instructions.  Will watch your condition.  Will get help right away if you are not doing well or get worse. Document Released: 01/25/2000 Document Revised: 04/21/2011 Document Reviewed:  05/14/2009 ExitCare Patient Information 2015 [employer]. This information is not intended to replace advice given to you by your health care provider. Make sure you discuss any questions you have with your health care provider.  

## 2014-09-06 NOTE — ED Notes (Signed)
C/o right eye pain and swelling, denies any visual disturbances

## 2014-09-06 NOTE — ED Notes (Signed)
Pt seen and assessed by provider, see providers note for full details. 

## 2014-09-06 NOTE — ED Provider Notes (Signed)
[facility] Emergency Department Provider Note  ____________________________________________  Time seen: Approximately 12:31 PM  I have reviewed the triage vital signs and the nursing notes.   HISTORY  Chief Complaint Eye Pain    HPI Haley Shepherd is a 34 y.o. female patient complaining of eye and a bump under her upper eyelid. Patient stated onset 4 days ago and is getting increasingly larger. Patient denies any vision disturbance. Patient denies wearing contact lenses or any other provocative incident for this complaint. Patient is rating her pain as a 5/10 and describes it as dull.   Past Medical History  Diagnosis Date  . Bartholin cyst   . Depression     postpartum  . Infection     HSV    Patient Active Problem List   Diagnosis Date Noted  . Anemia 01/03/2011  . Normal delivery 01/03/2011  . Twins 01/03/2011  . S/P tubal ligation 01/03/2011    Past Surgical History  Procedure Laterality Date  . Bartholin gland cyst excision    . Tubal ligation  01/02/2011    Procedure: POST PARTUM TUBAL LIGATION;  Surgeon: Esmeralda Arthur, MD;  Location: [facility];  Service: Gynecology;  Laterality: Bilateral;  . Vaginal delivery  01/01/2011    Procedure: VAGINAL DELIVERY;  Surgeon: Janine Limbo, MD;  Location: [facility];  Service: Gynecology;  Laterality: N/A;    Current Outpatient Rx  Name  Route  Sig  Dispense  Refill  . gentamicin (GARAMYCIN) 0.3 % ophthalmic solution   Right Eye   Place 1 drop into the right eye 3 (three) times daily.   5 mL   0   . ibuprofen (ADVIL,MOTRIN) 200 MG tablet   Oral   Take 400 mg by mouth every 6 (six) hours as needed for mild pain or moderate pain.           Allergies Review of patient's allergies indicates no known allergies.  Family History  Problem Relation Age of Onset  . Asthma Mother   . Asthma Sister   . Asthma Son     Social History History  Substance Use Topics  . Smoking status: Current  Every Day Smoker  . Smokeless tobacco: Never Used  . Alcohol Use: Yes    Review of Systems Constitutional: No fever/chills Eyes: No visual changes. Pain right eye swelling right eye. ENT: No sore throat. Cardiovascular: Denies chest pain. Respiratory: Denies shortness of breath. Gastrointestinal: No abdominal pain.  No nausea, no vomiting.  No diarrhea.  No constipation. Genitourinary: Negative for dysuria. Musculoskeletal: Negative for back pain. Skin: Negative for rash. Neurological: Negative for headaches, focal weakness or numbness. 10-point ROS otherwise negative.  ____________________________________________   PHYSICAL EXAM:  VITAL SIGNS: ED Triage Vitals  Enc Vitals Group     BP 09/06/14 1210 102/63 mmHg     Pulse Rate 09/06/14 1210 86     Resp --      Temp 09/06/14 1210 98.2 F (36.8 C)     Temp Source 09/06/14 1210 Oral     SpO2 09/06/14 1210 99 %     Weight 09/06/14 1210 98 lb (44.453 kg)     Height 09/06/14 1210 5\' 4"  (1.626 m)     Head Cir --      Peak Flow --      Pain Score 09/06/14 1210 5     Pain Loc --      Pain Edu? --      Excl. in GC? --  Constitutional: Alert and oriented. Well appearing and in no acute distress. Eyes: Conjunctivae are normal. PERRL. EOMI. erythematous upper eyelid. Papular lesion on the eyelid.. Head: Atraumatic. Nose: No congestion/rhinnorhea. Mouth/Throat: Mucous membranes are moist.  Oropharynx non-erythematous. Neck: No stridor.  No cervical spine tenderness to palpation. Hematological/Lymphatic/Immunilogical: No cervical lymphadenopathy. Cardiovascular: Normal rate, regular rhythm. Grossly normal heart sounds.  Good peripheral circulation. Respiratory: Normal respiratory effort.  No retractions. Lungs CTAB. Gastrointestinal: Soft and nontender. No distention. No abdominal bruits. No CVA tenderness. Musculoskeletal: No lower extremity tenderness nor edema.  No joint effusions. Neurologic:  Normal speech and language.  No gross focal neurologic deficits are appreciated. No gait instability. Skin:  Skin is warm, dry and intact. No rash noted. Psychiatric: Mood and affect are normal. Speech and behavior are normal.  ____________________________________________   LABS (all labs ordered are listed, but only abnormal results are displayed)  Labs Reviewed - No data to display ____________________________________________  EKG   ____________________________________________  RADIOLOGY   ____________________________________________   PROCEDURES  Procedure(s) performed: None  Critical Care performed: No  ____________________________________________   INITIAL IMPRESSION / ASSESSMENT AND PLAN / ED COURSE  Pertinent labs & imaging results that were available during my care of the patient were reviewed by me and considered in my medical decision making (see chart for details).  Chalazion right upper eyelid. Patient given instructions on home care. Advised to follow-up with ophthalmology clinic if no improvement or worsening her complaint in the next week.___________________________   FINAL CLINICAL IMPRESSION(S) / ED DIAGNOSES  Final diagnoses:  Chalazion, right      Joni Reining, PA-C 09/06/14 1243  Governor Rooks, MD 09/06/14 [phone]

## 2014-09-15 ENCOUNTER — Encounter: Payer: Self-pay | Admitting: Emergency Medicine

## 2014-09-15 ENCOUNTER — Emergency Department
Admission: EM | Admit: 2014-09-15 | Discharge: 2014-09-15 | Disposition: A | Discharge status: Home / Self Care | Payer: Medicaid Other | Attending: Student | Admitting: Student

## 2014-09-15 DIAGNOSIS — N760 Acute vaginitis: Secondary | ICD-10-CM | POA: Insufficient documentation

## 2014-09-15 DIAGNOSIS — Z720 Tobacco use: Secondary | ICD-10-CM | POA: Insufficient documentation

## 2014-09-15 DIAGNOSIS — B9689 Other specified bacterial agents as the cause of diseases classified elsewhere: Secondary | ICD-10-CM

## 2014-09-15 DIAGNOSIS — Z791 Long term (current) use of non-steroidal anti-inflammatories (NSAID): Secondary | ICD-10-CM | POA: Insufficient documentation

## 2014-09-15 DIAGNOSIS — Z3202 Encounter for pregnancy test, result negative: Secondary | ICD-10-CM | POA: Insufficient documentation

## 2014-09-15 DIAGNOSIS — R102 Pelvic and perineal pain: Secondary | ICD-10-CM

## 2014-09-15 DIAGNOSIS — Z792 Long term (current) use of antibiotics: Secondary | ICD-10-CM | POA: Insufficient documentation

## 2014-09-15 LAB — CHLAMYDIA/NGC RT PCR (ARMC ONLY)
CHLAMYDIA TR: NOT DETECTED
N GONORRHOEAE: NOT DETECTED

## 2014-09-15 LAB — WET PREP, GENITAL
TRICH WET PREP: NONE SEEN
YEAST WET PREP: NONE SEEN

## 2014-09-15 LAB — URINALYSIS COMPLETE WITH MICROSCOPIC (ARMC ONLY)
BILIRUBIN URINE: NEGATIVE
Glucose, UA: NEGATIVE mg/dL
Hgb urine dipstick: NEGATIVE
Leukocytes, UA: NEGATIVE
NITRITE: NEGATIVE
Protein, ur: NEGATIVE mg/dL
Specific Gravity, Urine: 1.023 (ref 1.005–1.030)
pH: 6 (ref 5.0–8.0)

## 2014-09-15 LAB — POCT PREGNANCY, URINE: Preg Test, Ur: NEGATIVE

## 2014-09-15 MED ORDER — KETOROLAC TROMETHAMINE 10 MG PO TABS: 10.0000 mg | ORAL_TABLET | Freq: Three times a day (TID) | ORAL | Status: DC

## 2014-09-15 MED ORDER — METRONIDAZOLE 500 MG PO TABS: 500.0000 mg | ORAL_TABLET | Freq: Two times a day (BID) | ORAL | Status: AC

## 2014-09-15 NOTE — ED Notes (Signed)
Pt comes in c/o of pelvic pain and lower back pain that started yesterday.  Explains that the pain started yesterday after lifting things at work.  Pt denies N/V, difficulty urinating.

## 2014-09-15 NOTE — ED Notes (Signed)
Assessed by PA 

## 2014-09-15 NOTE — Discharge Instructions (Signed)
Bacterial Vaginosis Bacterial vaginosis is a vaginal infection that occurs when the normal balance of bacteria in the vagina is disrupted. It results from an overgrowth of certain bacteria. This is the most common vaginal infection in women of childbearing age. Treatment is important to prevent complications, especially in pregnant women, as it can cause a premature delivery. CAUSES  Bacterial vaginosis is caused by an increase in harmful bacteria that are normally present in smaller amounts in the vagina. Several different kinds of bacteria can cause bacterial vaginosis. However, the reason that the condition develops is not fully understood. RISK FACTORS Certain activities or behaviors can put you at an increased risk of developing bacterial vaginosis, including:  Having a new sex partner or multiple sex partners.  Douching.  Using an intrauterine device (IUD) for contraception. Women do not get bacterial vaginosis from toilet seats, bedding, swimming pools, or contact with objects around them. SIGNS AND SYMPTOMS  Some women with bacterial vaginosis have no signs or symptoms. Common symptoms include:  Grey vaginal discharge.  A fishlike odor with discharge, especially after sexual intercourse.  Itching or burning of the vagina and vulva.  Burning or pain with urination. DIAGNOSIS  Your health care provider will take a medical history and examine the vagina for signs of bacterial vaginosis. A sample of vaginal fluid may be taken. Your health care provider will look at this sample under a microscope to check for bacteria and abnormal cells. A vaginal pH test may also be done.  TREATMENT  Bacterial vaginosis may be treated with antibiotic medicines. These may be given in the form of a pill or a vaginal cream. A second round of antibiotics may be prescribed if the condition comes back after treatment.  HOME CARE INSTRUCTIONS   Only take over-the-counter or prescription medicines as  directed by your health care provider.  If antibiotic medicine was prescribed, take it as directed. Make sure you finish it even if you start to feel better.  Do not have sex until treatment is completed.  Tell all sexual partners that you have a vaginal infection. They should see their health care provider and be treated if they have problems, such as a mild rash or itching.  Practice safe sex by using condoms and only having one sex partner. SEEK MEDICAL CARE IF:   Your symptoms are not improving after 3 days of treatment.  You have increased discharge or pain.  You have a fever. MAKE SURE YOU:   Understand these instructions.  Will watch your condition.  Will get help right away if you are not doing well or get worse. FOR MORE INFORMATION  Centers for Disease Control and Prevention, Division of STD Prevention: [employer]raiser[employer]raud[employer] American Sexual Health Association (ASHA): www.ashastd.org  Document Released: 01/27/2005 Document Revised: 11/17/2012 Document Reviewed: 09/08/2012 [facility] Patient Information 2015 [place], [place]. This information is not intended to replace advice given to you by your health care provider. Make sure you discuss any questions you have with your health care provider.  Pelvic Pain Pelvic pain is pain felt below the belly button and between your hips. It can be caused by many different things. It is important to get help right away. This is especially true for severe, sharp, or unusual pain that comes on suddenly.  HOME CARE  Only take medicine as told by your doctor.  Rest as told by your doctor.  Eat a healthy diet, such as fruits, vegetables, and lean meats.  Drink enough fluids to keep your pee (  urine) clear or pale yellow, or as told.  Avoid sex (intercourse) if it causes pain.  Apply warm or cold packs to your lower belly (abdomen). Use the type of pack that helps the pain.  Avoid situations that cause you stress.  Keep a journal to track  your pain. Write down:  When the pain started.  Where it is located.  If there are things that seem to be related to the pain, such as food or your period.  Follow up with your doctor as told. GET HELP RIGHT AWAY IF:   You have heavy bleeding from the vagina.  You have more pelvic pain.  You feel lightheaded or pass out (faint).  You have chills.  You have pain when you pee or have blood in your pee.  You cannot stop having watery poop (diarrhea).  You cannot stop throwing up (vomiting).  You have a fever or lasting symptoms for more than 3 days.  You have a fever and your symptoms suddenly get worse.  You are being physically or sexually abused.  Your medicine does not help your pain.  You have fluid (discharge) coming from your vagina that is not normal. MAKE SURE YOU:  Understand these instructions.  Will watch your condition.  Will get help if you are not doing well or get worse. Document Released: 07/16/2007 Document Revised: 07/29/2011 Document Reviewed: 05/19/2011 [facility] Patient Information 2015 [place], [place]. This information is not intended to replace advice given to you by your health care provider. Make sure you discuss any questions you have with your health care provider.  You may receive a call if your other test results are positive. Take the antibiotic until completely gone. Return as needed, or see the health department.

## 2014-09-15 NOTE — ED Notes (Signed)
Has bilateral groin pain after lifting boxes at work last pm

## 2014-09-15 NOTE — ED Provider Notes (Signed)
[facility] Emergency Department Provider Note ____________________________________________  Time seen: 1729  I have reviewed the triage vital signs and the nursing notes.  HISTORY  Chief Complaint  Flank Pain and Pelvic Pain  HPI Haley Shepherd is a 34 y.o. female who reports to the ED for evaluationof bilateral groin/flank pain, with onset last night while at work. She describes pelvic pain radiates to the lower back that started yesterday. She was at work lifting some heavy equipment at [employer]. She denies any nausea, vomiting, dysuria, or vaginal discharge. She otherwise reports normal appetite, denies any bad food, recent travel, or sick contacts.  Past Medical History  Diagnosis Date  . Bartholin cyst   . Depression     postpartum  . Infection     HSV    Patient Active Problem List   Diagnosis Date Noted  . Anemia 01/03/2011  . Normal delivery 01/03/2011  . Twins 01/03/2011  . S/P tubal ligation 01/03/2011    Past Surgical History  Procedure Laterality Date  . Bartholin gland cyst excision    . Tubal ligation  01/02/2011    Procedure: POST PARTUM TUBAL LIGATION;  Surgeon: Esmeralda Arthur, MD;  Location: [facility];  Service: Gynecology;  Laterality: Bilateral;  . Vaginal delivery  01/01/2011    Procedure: VAGINAL DELIVERY;  Surgeon: Janine Limbo, MD;  Location: [facility];  Service: Gynecology;  Laterality: N/A;    Current Outpatient Rx  Name  Route  Sig  Dispense  Refill  . gentamicin (GARAMYCIN) 0.3 % ophthalmic solution   Right Eye   Place 1 drop into the right eye 3 (three) times daily.   5 mL   0   . ibuprofen (ADVIL,MOTRIN) 200 MG tablet   Oral   Take 400 mg by mouth every 6 (six) hours as needed for mild pain or moderate pain.         Marland Kitchen ketorolac (TORADOL) 10 MG tablet   Oral   Take 1 tablet (10 mg total) by mouth every 8 (eight) hours.   15 tablet   0   . metroNIDAZOLE (FLAGYL) 500 MG tablet   Oral   Take 1 tablet (500  mg total) by mouth 2 (two) times daily.   14 tablet   0    Allergies Review of patient's allergies indicates no known allergies.  Family History  Problem Relation Age of Onset  . Asthma Mother   . Asthma Sister   . Asthma Son     Social History History  Substance Use Topics  . Smoking status: Current Every Day Smoker -- 0.25 packs/day  . Smokeless tobacco: Never Used  . Alcohol Use: Yes   Review of Systems  Constitutional: Negative for fever. Eyes: Negative for visual changes. ENT: Negative for sore throat. Cardiovascular: Negative for chest pain. Respiratory: Negative for shortness of breath. Gastrointestinal: Negative for abdominal pain, vomiting and diarrhea. Genitourinary: Negative for dysuria. Pelvic pain as above Musculoskeletal: Negative for back pain. Skin: Negative for rash. Neurological: Negative for headaches, focal weakness or numbness. ____________________________________________  PHYSICAL EXAM:  VITAL SIGNS: ED Triage Vitals  Enc Vitals Group     BP 09/15/14 1558 103/72 mmHg     Pulse Rate 09/15/14 1558 73     Resp 09/15/14 1558 16     Temp 09/15/14 1558 98.2 F (36.8 C)     Temp Source 09/15/14 1558 Oral     SpO2 09/15/14 1558 98 %     Weight 09/15/14 1558  98 lb (44.453 kg)     Height 09/15/14 1558  (1.549 m)     Head Cir --      Peak Flow --      Pain Score 09/15/14 1559 7     Pain Loc --      Pain Edu? --      Excl. in GC? --    Constitutional: Alert and oriented. Well appearing and in no distress. Eyes: Conjunctivae are normal. PERRL. Normal extraocular movements. ENT   Head: Normocephalic and atraumatic.   Nose: No congestion/rhinnorhea.   Mouth/Throat: Mucous membranes are moist.   Neck: Supple. No thyromegaly. Hematological/Lymphatic/Immunilogical: No cervical lymphadenopathy. Cardiovascular: Normal rate, regular rhythm.  Respiratory: Normal respiratory effort. No wheezes/rales/rhonchi. Gastrointestinal: Soft and  nontender. No distention. GU: Normal external genitalia. Speculum exam reveals scant, whitish vaginal discharge. Cervix is closed. No adnexal masses or cervical motion tenderness.  Musculoskeletal: Nontender with normal range of motion in all extremities.  Neurologic:  Normal gait without ataxia. Normal speech and language. No gross focal neurologic deficits are appreciated. Skin:  Skin is warm, dry and intact. No rash noted. Psychiatric: Mood and affect are normal. Patient exhibits appropriate insight and judgment. ____________________________________________    LABS (pertinent positives/negatives) Labs Reviewed  WET PREP, GENITAL - Abnormal; Notable for the following:    Clue Cells Wet Prep HPF POC FEW (*)    WBC, Wet Prep HPF POC MODERATE (*)    All other components within normal limits  URINALYSIS COMPLETEWITH MICROSCOPIC ([facility] ONLY) - Abnormal; Notable for the following:    Color, Urine YELLOW (*)    APPearance HAZY (*)    Ketones, ur TRACE (*)    Bacteria, UA RARE (*)    Squamous Epithelial / LPF 6-30 (*)    All other components within normal limits  CHLAMYDIA/NGC RT PCR ([facility] ONLY)  POC URINE PREG, ED  POCT PREGNANCY, URINE  ____________________________________________  PROCEDURES  Toradol 20 mg PO ____________________________________________  INITIAL IMPRESSION / ASSESSMENT AND PLAN / ED COURSE  Treatment for BV following normal pelvic exam. Suggest follow-up with the health department for ongoing symptoms. Return as needed for worsening symptoms. Flagyl prescription provided.  ____________________________________________  FINAL CLINICAL IMPRESSION(S) / ED DIAGNOSES  Final diagnoses:  BV (bacterial vaginosis)  Pelvic pain in female     Lissa Hoard, PA-C 09/17/14 1652  Gayla Doss, MD 09/17/14 [id number]

## 2014-10-26 ENCOUNTER — Emergency Department
Admission: EM | Admit: 2014-10-26 | Discharge: 2014-10-26 | Disposition: A | Discharge status: Home / Self Care | Payer: Self-pay | Attending: Emergency Medicine | Admitting: Emergency Medicine

## 2014-10-26 DIAGNOSIS — Z79899 Other long term (current) drug therapy: Secondary | ICD-10-CM | POA: Insufficient documentation

## 2014-10-26 DIAGNOSIS — Z3202 Encounter for pregnancy test, result negative: Secondary | ICD-10-CM | POA: Insufficient documentation

## 2014-10-26 DIAGNOSIS — B9689 Other specified bacterial agents as the cause of diseases classified elsewhere: Secondary | ICD-10-CM

## 2014-10-26 DIAGNOSIS — Z720 Tobacco use: Secondary | ICD-10-CM | POA: Insufficient documentation

## 2014-10-26 DIAGNOSIS — N760 Acute vaginitis: Secondary | ICD-10-CM | POA: Insufficient documentation

## 2014-10-26 LAB — URINALYSIS COMPLETE WITH MICROSCOPIC (ARMC ONLY)
BACTERIA UA: NONE SEEN
Bilirubin Urine: NEGATIVE
GLUCOSE, UA: NEGATIVE mg/dL
HGB URINE DIPSTICK: NEGATIVE
Ketones, ur: NEGATIVE mg/dL
Leukocytes, UA: NEGATIVE
Nitrite: NEGATIVE
Protein, ur: NEGATIVE mg/dL
Specific Gravity, Urine: 1.01 (ref 1.005–1.030)
pH: 6 (ref 5.0–8.0)

## 2014-10-26 LAB — WET PREP, GENITAL
TRICH WET PREP: NONE SEEN
Yeast Wet Prep HPF POC: NONE SEEN

## 2014-10-26 LAB — CHLAMYDIA/NGC RT PCR (ARMC ONLY)
CHLAMYDIA TR: NOT DETECTED
N gonorrhoeae: NOT DETECTED

## 2014-10-26 LAB — POCT PREGNANCY, URINE: PREG TEST UR: NEGATIVE

## 2014-10-26 MED ORDER — METRONIDAZOLE 500 MG PO TABS: 500.0000 mg | ORAL_TABLET | Freq: Two times a day (BID) | ORAL | Status: AC

## 2014-10-26 NOTE — Discharge Instructions (Signed)
Bacterial Vaginosis Bacterial vaginosis is a vaginal infection that occurs when the normal balance of bacteria in the vagina is disrupted. It results from an overgrowth of certain bacteria. This is the most common vaginal infection in women of childbearing age. Treatment is important to prevent complications, especially in pregnant women, as it can cause a premature delivery. CAUSES  Bacterial vaginosis is caused by an increase in harmful bacteria that are normally present in smaller amounts in the vagina. Several different kinds of bacteria can cause bacterial vaginosis. However, the reason that the condition develops is not fully understood. RISK FACTORS Certain activities or behaviors can put you at an increased risk of developing bacterial vaginosis, including:  Having a new sex partner or multiple sex partners.  Douching.  Using an intrauterine device (IUD) for contraception. Women do not get bacterial vaginosis from toilet seats, bedding, swimming pools, or contact with objects around them. SIGNS AND SYMPTOMS  Some women with bacterial vaginosis have no signs or symptoms. Common symptoms include:  Grey vaginal discharge.  A fishlike odor with discharge, especially after sexual intercourse.  Itching or burning of the vagina and vulva.  Burning or pain with urination. DIAGNOSIS  Your health care provider will take a medical history and examine the vagina for signs of bacterial vaginosis. A sample of vaginal fluid may be taken. Your health care provider will look at this sample under a microscope to check for bacteria and abnormal cells. A vaginal pH test may also be done.  TREATMENT  Bacterial vaginosis may be treated with antibiotic medicines. These may be given in the form of a pill or a vaginal cream. A second round of antibiotics may be prescribed if the condition comes back after treatment.  HOME CARE INSTRUCTIONS   Only take over-the-counter or prescription medicines as  directed by your health care provider.  If antibiotic medicine was prescribed, take it as directed. Make sure you finish it even if you start to feel better.  Do not have sex until treatment is completed.  Tell all sexual partners that you have a vaginal infection. They should see their health care provider and be treated if they have problems, such as a mild rash or itching.  Practice safe sex by using condoms and only having one sex partner. SEEK MEDICAL CARE IF:   Your symptoms are not improving after 3 days of treatment.  You have increased discharge or pain.  You have a fever. MAKE SURE YOU:   Understand these instructions.  Will watch your condition.  Will get help right away if you are not doing well or get worse. FOR MORE INFORMATION  Centers for Disease Control and Prevention, Division of STD Prevention: www.cdc.gov/std American Sexual Health Association (ASHA): www.ashastd.org  Document Released: 01/27/2005 Document Revised: 11/17/2012 Document Reviewed: 09/08/2012 [employer] Patient Information 2015 [employer]. This information is not intended to replace advice given to you by your health care provider. Make sure you discuss any questions you have with your health care provider.  

## 2014-10-26 NOTE — ED Notes (Signed)
Pt c/o vaginal discharge for the past week ..denies pain

## 2014-10-26 NOTE — ED Provider Notes (Signed)
[facility] Emergency Department Provider Note ____________________________________________  Time seen: Approximately 8:26 AM  I have reviewed the triage vital signs and the nursing notes.   HISTORY  Chief Complaint Vaginal Discharge   HPI Haley Shepherd is a 34 y.o. female who presents to the emergency department for evaluation of vaginal discharge. She deniesknown exposure to STD. She recently finished an antibiotic but she denies vaginal itching. Discharge is grey/white. She denies abdominal pain or dysuria.   Past Medical History  Diagnosis Date  . Bartholin cyst   . Depression     postpartum  . Infection     HSV    Patient Active Problem List   Diagnosis Date Noted  . Anemia 01/03/2011  . Normal delivery 01/03/2011  . Twins 01/03/2011  . S/P tubal ligation 01/03/2011    Past Surgical History  Procedure Laterality Date  . Bartholin gland cyst excision    . Tubal ligation  01/02/2011    Procedure: POST PARTUM TUBAL LIGATION;  Surgeon: Esmeralda Arthur, MD;  Location: [facility];  Service: Gynecology;  Laterality: Bilateral;  . Vaginal delivery  01/01/2011    Procedure: VAGINAL DELIVERY;  Surgeon: Janine Limbo, MD;  Location: [facility];  Service: Gynecology;  Laterality: N/A;    Current Outpatient Rx  Name  Route  Sig  Dispense  Refill  . gentamicin (GARAMYCIN) 0.3 % ophthalmic solution   Right Eye   Place 1 drop into the right eye 3 (three) times daily.   5 mL   0   . ibuprofen (ADVIL,MOTRIN) 200 MG tablet   Oral   Take 400 mg by mouth every 6 (six) hours as needed for mild pain or moderate pain.         Marland Kitchen ketorolac (TORADOL) 10 MG tablet   Oral   Take 1 tablet (10 mg total) by mouth every 8 (eight) hours.   15 tablet   0   . metroNIDAZOLE (FLAGYL) 500 MG tablet   Oral   Take 1 tablet (500 mg total) by mouth 2 (two) times daily.   14 tablet   0     Allergies Review of patient's allergies indicates no known  allergies.  Family History  Problem Relation Age of Onset  . Asthma Mother   . Asthma Sister   . Asthma Son     Social History Social History  Substance Use Topics  . Smoking status: Current Every Day Smoker -- 0.25 packs/day  . Smokeless tobacco: Never Used  . Alcohol Use: Yes    Review of Systems Constitutional: No fever/chills Cardiovascular: Denies chest pain. Respiratory: Denies shortness of breath or cough. Gastrointestinal: Abdominal pain no., nausea no, vomitingno. Genitourinary: Dysuria no, vaginal discharge yes.. Musculoskeletal: Negative for back pain. Skin: Negative for rash. Neurological: Negative for headaches, focal weakness or numbness.  10-point ROS otherwise negative.  ____________________________________________   PHYSICAL EXAM:  VITAL SIGNS: ED Triage Vitals  Enc Vitals Group     BP 10/26/14 0814 110/79 mmHg     Pulse Rate 10/26/14 0814 59     Resp 10/26/14 0814 16     Temp 10/26/14 0814 97.9 F (36.6 C)     Temp Source 10/26/14 0814 Oral     SpO2 10/26/14 0814 100 %     Weight 10/26/14 0814 95 lb (43.092 kg)     Height 10/26/14 0814 5\' 4"  (1.626 m)     Head Cir --      Peak Flow --  Pain Score --      Pain Loc --      Pain Edu? --      Excl. in GC? --     Constitutional: Alert and oriented. Well appearing and in no acute distress. Eyes: Conjunctivae are normal. PERRL. EOMI. Head: Atraumatic. Nose: No congestion/rhinnorhea. Mouth/Throat: Mucous membranes are moist.  Oropharynx non-erythematous. Neck: No stridor. Cardiovascular: Good peripheral circulation. Respiratory: Normal respiratory effort.  No retractions. Gastrointestinal: Soft and nontender. No distention. No abdominal bruits. Genitourinary: Pelvic exam: Normal exterior exam, white discharge on vaginal walls, thin discharge noted at cervical os. No cervical lesions noted, cervix closed. No adnexal tenderness on bimanual. Musculoskeletal: No extremity tenderness nor edema.   Neurologic:  Normal speech and language. No gross focal neurologic deficits are appreciated. Speech is normal. No gait instability. Skin:  Skin is warm, dry and intact. No rash noted. Psychiatric: Mood and affect are normal. Speech and behavior are normal.  ____________________________________________   LABS (all labs ordered are listed, but only abnormal results are displayed)  Labs Reviewed  WET PREP, GENITAL - Abnormal; Notable for the following:    Clue Cells Wet Prep HPF POC MODERATE (*)    WBC, Wet Prep HPF POC FEW (*)    All other components within normal limits  URINALYSIS COMPLETEWITH MICROSCOPIC ([facility] ONLY) - Abnormal; Notable for the following:    Color, Urine STRAW (*)    APPearance CLEAR (*)    Squamous Epithelial / LPF 0-5 (*)    All other components within normal limits  CHLAMYDIA/NGC RT PCR ([facility] ONLY)  POC URINE PREG, ED  POCT PREGNANCY, URINE   ____________________________________________  RADIOLOGY   ____________________________________________   PROCEDURES  Procedure(s) performed: None  ____________________________________________   INITIAL IMPRESSION / ASSESSMENT AND PLAN / ED COURSE  Pertinent labs & imaging results that were available during my care of the patient were reviewed by me and considered in my medical decision making (see chart for details).  Patient will be treated with Flagyl. No GC Chlamydia treatment here today, will await results. She will follow up with GYN if no improvement in the next week or return to the emergency department for symptoms that change or worsen if unable to schedule an appointment. ____________________________________________   FINAL CLINICAL IMPRESSION(S) / ED DIAGNOSES  Final diagnoses:  Bacterial vaginosis       Chinita Pester, FNP 10/26/14 1610  Jeanmarie Plant, MD 10/26/14 1501

## 2015-02-11 ENCOUNTER — Emergency Department
Admission: EM | Admit: 2015-02-11 | Discharge: 2015-02-11 | Disposition: A | Discharge status: Home / Self Care | Payer: Self-pay | Attending: Emergency Medicine | Admitting: Emergency Medicine

## 2015-02-11 DIAGNOSIS — F17200 Nicotine dependence, unspecified, uncomplicated: Secondary | ICD-10-CM | POA: Insufficient documentation

## 2015-02-11 DIAGNOSIS — H6122 Impacted cerumen, left ear: Secondary | ICD-10-CM | POA: Insufficient documentation

## 2015-02-11 DIAGNOSIS — Z792 Long term (current) use of antibiotics: Secondary | ICD-10-CM | POA: Insufficient documentation

## 2015-02-11 DIAGNOSIS — Z791 Long term (current) use of non-steroidal anti-inflammatories (NSAID): Secondary | ICD-10-CM | POA: Insufficient documentation
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
## 2015-02-11 NOTE — Discharge Instructions (Signed)
Cerumen Impaction The structures of the external ear canal secrete a waxy substance known as cerumen. Excess cerumen can build up in the ear canal, causing a condition known as cerumen impaction. Cerumen impaction can cause ear pain and disrupt the function of the ear. The rate of cerumen production differs for each individual. In certain individuals, the configuration of the ear canal may decrease his or her ability to naturally remove cerumen. CAUSES Cerumen impaction is caused by excessive cerumen production or buildup. RISK FACTORS  Frequent use of swabs to clean ears.  Having narrow ear canals.  Having eczema.  Being dehydrated. SIGNS AND SYMPTOMS  Diminished hearing.  Ear drainage.  Ear pain.  Ear itch. TREATMENT Treatment may involve:  Over-the-counter or prescription ear drops to soften the cerumen.  Removal of cerumen by a health care provider. This may be done with:  Irrigation with warm water. This is the most common method of removal.  Ear curettes and other instruments.  Surgery. This may be done in severe cases. HOME CARE INSTRUCTIONS  Take medicines only as directed by your health care provider.  Do not insert objects into the ear with the intent of cleaning the ear. PREVENTION  Do not insert objects into the ear, even with the intent of cleaning the ear. Removing cerumen as a part of normal hygiene is not necessary, and the use of swabs in the ear canal is not recommended.  Drink enough water to keep your urine clear or pale yellow.  Control your eczema if you have it. SEEK MEDICAL CARE IF:  You develop ear pain.  You develop bleeding from the ear.  The cerumen does not clear after you use ear drops as directed.   This information is not intended to replace advice given to you by your health care provider. Make sure you discuss any questions you have with your health care provider.   Document Released: 03/06/2004 Document Revised: 02/17/2014  Document Reviewed: 09/13/2014 Elsevier Interactive Patient Education 2016 [employer]  

## 2015-02-11 NOTE — ED Notes (Signed)
Ear wax removed using sterile saline and iv catheter

## 2015-02-11 NOTE — ED Notes (Signed)
Pt states that her ear has been bothering for the past couple days but today it started hurting worse

## 2016-01-16 ENCOUNTER — Emergency Department
Admission: EM | Admit: 2016-01-16 | Discharge: 2016-01-16 | Disposition: A | Discharge status: Home / Self Care | Payer: Self-pay | Attending: Emergency Medicine | Admitting: Emergency Medicine

## 2016-01-16 ENCOUNTER — Encounter: Payer: Self-pay | Admitting: *Deleted

## 2016-01-16 DIAGNOSIS — Y939 Activity, unspecified: Secondary | ICD-10-CM | POA: Insufficient documentation

## 2016-01-16 DIAGNOSIS — S0501XA Injury of conjunctiva and corneal abrasion without foreign body, right eye, initial encounter: Secondary | ICD-10-CM | POA: Insufficient documentation

## 2016-01-16 DIAGNOSIS — X58XXXA Exposure to other specified factors, initial encounter: Secondary | ICD-10-CM | POA: Insufficient documentation

## 2016-01-16 DIAGNOSIS — F17200 Nicotine dependence, unspecified, uncomplicated: Secondary | ICD-10-CM | POA: Insufficient documentation

## 2016-01-16 DIAGNOSIS — Y999 Unspecified external cause status: Secondary | ICD-10-CM | POA: Insufficient documentation

## 2016-01-16 DIAGNOSIS — Y929 Unspecified place or not applicable: Secondary | ICD-10-CM | POA: Insufficient documentation

## 2016-01-16 MED ORDER — FLUORESCEIN SODIUM 1 MG OP STRP
ORAL_STRIP | OPHTHALMIC | Status: AC
  Administered 2016-01-16: 1 via OPHTHALMIC
  Filled 2016-01-16: qty 1

## 2016-01-16 MED ORDER — TETRACAINE HCL 0.5 % OP SOLN
2.0000 [drp] | Freq: Once | OPHTHALMIC | Status: AC
  Administered 2016-01-16: 2 [drp] via OPHTHALMIC

## 2016-01-16 MED ORDER — FLUORESCEIN SODIUM 1 MG OP STRP
1.0000 | ORAL_STRIP | Freq: Once | OPHTHALMIC | Status: AC
  Administered 2016-01-16: 1 via OPHTHALMIC

## 2016-01-16 MED ORDER — ERYTHROMYCIN 5 MG/GM OP OINT: TOPICAL_OINTMENT | Freq: Once | OPHTHALMIC | Status: DC

## 2016-01-16 MED ORDER — TETRACAINE HCL 0.5 % OP SOLN
OPHTHALMIC | Status: AC
  Administered 2016-01-16: 2 [drp] via OPHTHALMIC
  Filled 2016-01-16: qty 2

## 2016-01-16 MED ORDER — EYE WASH OPHTH SOLN: 2.0000 [drp] | OPHTHALMIC | Status: DC | PRN

## 2016-01-16 MED ORDER — ERYTHROMYCIN 5 MG/GM OP OINT: 1.0000 "application " | TOPICAL_OINTMENT | Freq: Four times a day (QID) | OPHTHALMIC | 0 refills | Status: DC

## 2016-01-16 NOTE — ED Provider Notes (Signed)
[facility] Emergency Department Provider Note  ____________________________________________  Time seen: Approximately 4:28 PM  I have reviewed the triage vital signs and the nursing notes.   HISTORY  Chief Complaint Eye Problem    HPI Haley Shepherd is a 35 y.o. female , NAD, presents to the emergency department with 2 day history of right eye redness and irritation. Patient states she noticed an eyelash in her right eye yesterday. Was able to remove the eyelash on the bottom lid but woke today with increased redness, irritation and watering from the right eye. Has had no changes in vision, loss of vision or floaters in her vision. Denies any injury or trauma to the eye. Has not noted any thick discharge nor was her eye matted shut. States that she has the sensation of grit or sand in the eye and no pinpoint pain. Denies any recent upper rest for symptoms such as nasal congestion, runny nose, sinus pressure or pressure. No fevers, chills, body aches.   Past Medical History:  Diagnosis Date  . Bartholin cyst   . Depression    postpartum  . Infection    HSV    Patient Active Problem List   Diagnosis Date Noted  . Anemia 01/03/2011  . Normal delivery 01/03/2011  . Twins 01/03/2011  . S/P tubal ligation 01/03/2011    Past Surgical History:  Procedure Laterality Date  . BARTHOLIN GLAND CYST EXCISION    . TUBAL LIGATION  01/02/2011   Procedure: POST PARTUM TUBAL LIGATION;  Surgeon: Esmeralda ArthurSandra A Rivard, MD;  Location: [facility];  Service: Gynecology;  Laterality: Bilateral;  . VAGINAL DELIVERY  01/01/2011   Procedure: VAGINAL DELIVERY;  Surgeon: Janine LimboArthur V Stringer, MD;  Location: [facility];  Service: Gynecology;  Laterality: N/A;    Prior to Admission medications   Medication Sig Start Date End Date Taking? Authorizing Provider  erythromycin ophthalmic ointment Place 1 application into the left eye 4 (four) times daily. Apply 1cm ribbon to lower eyelid 4 times  daily. 01/16/16   Sarayah Bacchi L Teretha Chalupa, PA-C  gentamicin (GARAMYCIN) 0.3 % ophthalmic solution Place 1 drop into the right eye 3 (three) times daily. 09/06/14   Joni Reiningonald K Smith, PA-C  ibuprofen (ADVIL,MOTRIN) 200 MG tablet Take 400 mg by mouth every 6 (six) hours as needed for mild pain or moderate pain.    Historical Provider, MD  ketorolac (TORADOL) 10 MG tablet Take 1 tablet (10 mg total) by mouth every 8 (eight) hours. 09/15/14   Charlesetta IvoryJenise V Bacon Menshew, PA-C    Allergies Patient has no known allergies.  Family History  Problem Relation Age of Onset  . Asthma Mother   . Asthma Sister   . Asthma Son     Social History Social History  Substance Use Topics  . Smoking status: Current Every Day Smoker    Packs/day: 0.25  . Smokeless tobacco: Never Used  . Alcohol use Yes     Review of Systems  Constitutional: No fever/chills Eyes: Positive redness, irritation, discharge about the right eye. No visual changes, loss of vision or floaters in vision. ENT: No nasal congestion, runny nose, sinus pressure, ear pressure. Cardiovascular: No chest pain. Respiratory: No cough, chest congestion.  Musculoskeletal: Negative for neck pain.  Skin: Negative for rash, redness, swelling.  ____________________________________________   PHYSICAL EXAM:  VITAL SIGNS: ED Triage Vitals  Enc Vitals Group     BP 01/16/16 1620 103/61     Pulse Rate 01/16/16 1620 81  Resp 01/16/16 1620 20     Temp 01/16/16 1620 98.9 F (37.2 C)     Temp Source 01/16/16 1620 Oral     SpO2 01/16/16 1620 98 %     Weight 01/16/16 1616 103 lb (46.7 kg)     Height 01/16/16 1616 5\' 4"  (1.626 m)     Head Circumference --      Peak Flow --      Pain Score --      Pain Loc --      Pain Edu? --      Excl. in GC? --      Constitutional: Alert and oriented. Well appearing and in no acute distress. Eyes: Right conjunctiva with moderate injection, profuse clear drainage. Floor seen staining of the right eye revealed 2 mm  abrasion about the central cornea without evidence of foreign body. No work fall sign. No pain to palpation of bilateral globes. No hyphema. PERRLA. EOMI without pain.  Head: Atraumatic. ENT:      Ears: No discharge noted from bilateral canals      Nose: No congestion/rhinnorhea.      Mouth/Throat: Mucous membranes are moist.  Neck: Supple with full range of motion Hematological/Lymphatic/Immunilogical: No cervical lymphadenopathy. Cardiovascular:  Good peripheral circulation. Respiratory: Normal respiratory effort without tachypnea or retractions. Neurologic:  Normal speech and language. No gross focal neurologic deficits are appreciated.  Skin:  Skin is warm, dry and intact. No rash noted. Psychiatric: Mood and affect are normal. Speech and behavior are normal. Patient exhibits appropriate insight and judgement.   ____________________________________________   LABS  None ____________________________________________  EKG  None ____________________________________________  RADIOLOGY  None ____________________________________________    PROCEDURES  Procedure(s) performed: None   Procedures   Medications  erythromycin ophthalmic ointment (not administered)  eye wash ((SODIUM/POTASSIUM/SOD CHLORIDE)) ophthalmic solution 2 drop (not administered)  fluorescein ophthalmic strip 1 strip (1 strip Right Eye Given by Other 01/16/16 1659)  tetracaine (PONTOCAINE) 0.5 % ophthalmic solution 2 drop (2 drops Right Eye Given 01/16/16 1630)     ____________________________________________   INITIAL IMPRESSION / ASSESSMENT AND PLAN / ED COURSE  Pertinent labs & imaging results that were available during my care of the patient were reviewed by me and considered in my medical decision making (see chart for details).  Clinical Course     Patient's diagnosis is consistent with Abrasion right cornea. Erythromycin ophthalmic ointment was applied to the patient's right eye prior to  discharge. Patient will be discharged home with prescriptions for erythromycin ophthalmic ointment. Patient is to follow up with ophthalmology if symptoms persist past this treatment course. Patient is given ED precautions to return to the ED for any worsening or new symptoms.    ____________________________________________  FINAL CLINICAL IMPRESSION(S) / ED DIAGNOSES  Final diagnoses:  Abrasion of right cornea, initial encounter      NEW MEDICATIONS STARTED DURING THIS VISIT:  Discharge Medication List as of 01/16/2016  4:47 PM    START taking these medications   Details  erythromycin ophthalmic ointment Place 1 application into the left eye 4 (four) times daily. Apply 1cm ribbon to lower eyelid 4 times daily., Starting Wed 01/16/2016, Print             Ernestene KielJami L ErinHagler, PA-C 01/16/16 1753    Sharyn CreamerMark Quale, MD 01/17/16 [phone]

## 2016-01-16 NOTE — ED Triage Notes (Signed)
Pt right eye is red, pt reports "I feel like there is something in my eye"

## 2016-07-07 ENCOUNTER — Emergency Department
Admission: EM | Admit: 2016-07-07 | Discharge: 2016-07-07 | Disposition: A | Discharge status: Home / Self Care | Payer: Self-pay | Attending: Emergency Medicine | Admitting: Emergency Medicine

## 2016-07-07 ENCOUNTER — Encounter: Payer: Self-pay | Admitting: Emergency Medicine

## 2016-07-07 DIAGNOSIS — F17200 Nicotine dependence, unspecified, uncomplicated: Secondary | ICD-10-CM | POA: Insufficient documentation

## 2016-07-07 DIAGNOSIS — K047 Periapical abscess without sinus: Secondary | ICD-10-CM | POA: Insufficient documentation

## 2016-07-07 MED ORDER — LIDOCAINE VISCOUS 2 % MT SOLN: 10.0000 mL | OROMUCOSAL | 0 refills | Status: DC | PRN

## 2016-07-07 MED ORDER — IBUPROFEN 800 MG PO TABS: 800.0000 mg | ORAL_TABLET | Freq: Three times a day (TID) | ORAL | 0 refills | Status: DC | PRN

## 2016-07-07 MED ORDER — AMOXICILLIN 500 MG PO CAPS: 500.0000 mg | ORAL_CAPSULE | Freq: Three times a day (TID) | ORAL | 0 refills | Status: DC

## 2016-07-07 NOTE — ED Provider Notes (Signed)
[facility] Emergency Department Provider Note  ____________________________________________  Time seen: Approximately 9:06 PM  I have reviewed the triage vital signs and the nursing notes.   HISTORY  Chief Complaint Dental Pain    HPI Haley Shepherd is a 36 y.o. female that presents emergency department with upper right tooth pain for 2 days. Patient states that the tooth fell out about one year ago. She does not regularly see a dentist.She is frequently spitting drainage from her mouth. This morning she was having difficulty talking due to pain. She has taken Tylenol for pain, which has not helped. She denies fever, shortness breath, chest pain, nausea, vomiting, abdominal pain.   Past Medical History:  Diagnosis Date  . Bartholin cyst   . Depression    postpartum  . Infection    HSV    Patient Active Problem List   Diagnosis Date Noted  . Anemia 01/03/2011  . Normal delivery 01/03/2011  . Twins 01/03/2011  . S/P tubal ligation 01/03/2011    Past Surgical History:  Procedure Laterality Date  . BARTHOLIN GLAND CYST EXCISION    . TUBAL LIGATION  01/02/2011   Procedure: POST PARTUM TUBAL LIGATION;  Surgeon: Esmeralda ArthurSandra A Rivard, MD;  Location: [facility];  Service: Gynecology;  Laterality: Bilateral;  . VAGINAL DELIVERY  01/01/2011   Procedure: VAGINAL DELIVERY;  Surgeon: Janine LimboArthur V Stringer, MD;  Location: [facility];  Service: Gynecology;  Laterality: N/A;    Prior to Admission medications   Medication Sig Start Date End Date Taking? Authorizing Provider  amoxicillin (AMOXIL) 500 MG capsule Take 1 capsule (500 mg total) by mouth 3 (three) times daily. 07/07/16   Enid DerryWagner, Theodore Virgin, PA-C  erythromycin ophthalmic ointment Place 1 application into the left eye 4 (four) times daily. Apply 1cm ribbon to lower eyelid 4 times daily. 01/16/16   Hagler, Jami L, PA-C  gentamicin (GARAMYCIN) 0.3 % ophthalmic solution Place 1 drop into the right eye 3 (three) times daily.  09/06/14   Joni ReiningSmith, Ronald K, PA-C  ibuprofen (ADVIL,MOTRIN) 800 MG tablet Take 1 tablet (800 mg total) by mouth every 8 (eight) hours as needed. 07/07/16   Enid DerryWagner, Jalayiah Bibian, PA-C  ketorolac (TORADOL) 10 MG tablet Take 1 tablet (10 mg total) by mouth every 8 (eight) hours. 09/15/14   Menshew, Charlesetta IvoryJenise V Bacon, PA-C  lidocaine (XYLOCAINE) 2 % solution Use as directed 10 mLs in the mouth or throat as needed for mouth pain. 07/07/16   Enid DerryWagner, Nevaeh Korte, PA-C    Allergies Patient has no known allergies.  Family History  Problem Relation Age of Onset  . Asthma Mother   . Asthma Sister   . Asthma Son     Social History Social History  Substance Use Topics  . Smoking status: Current Every Day Smoker    Packs/day: 0.25  . Smokeless tobacco: Never Used  . Alcohol use Yes     Review of Systems  Constitutional: No fever/chills Cardiovascular: No chest pain. Respiratory: No SOB. Gastrointestinal: No abdominal pain.  No nausea, no vomiting.  Musculoskeletal: Negative for musculoskeletal pain. Skin: Negative for rash, abrasions, lacerations, ecchymosis.   ____________________________________________   PHYSICAL EXAM:  VITAL SIGNS: ED Triage Vitals [07/07/16 2008]  Enc Vitals Group     BP 107/75     Pulse Rate 70     Resp 18     Temp 98.5 F (36.9 C)     Temp Source Oral     SpO2 100 %     Weight 101  lb (45.8 kg)     Height 5\' 4"  (1.626 m)     Head Circumference      Peak Flow      Pain Score 8     Pain Loc      Pain Edu?      Excl. in GC?      Constitutional: Alert and oriented. Well appearing and in no acute distress. Eyes: Conjunctivae are normal. PERRL. EOMI. Head: Atraumatic. ENT:      Ears:      Nose: No congestion/rhinnorhea.      Mouth/Throat: Mucous membranes are moist. Upper right molar has broken off and there is surrounding tenderness to palpation. Yellow discharge present on right side of mouth. No swelling. No TMJ pain. Neck: No stridor.  Cardiovascular: Normal  rate, regular rhythm.  Good peripheral circulation. Respiratory: Normal respiratory effort without tachypnea or retractions. Lungs CTAB. Good air entry to the bases with no decreased or absent breath sounds. Musculoskeletal: Full range of motion to all extremities. No gross deformities appreciated. Neurologic:  Normal speech and language. No gross focal neurologic deficits are appreciated.  Skin:  Skin is warm, dry and intact. No rash noted.   ____________________________________________   LABS (all labs ordered are listed, but only abnormal results are displayed)  Labs Reviewed - No data to display ____________________________________________  EKG   ____________________________________________  RADIOLOGY  No results found.  ____________________________________________    PROCEDURES  Procedure(s) performed:    Procedures    Medications - No data to display   ____________________________________________   INITIAL IMPRESSION / ASSESSMENT AND PLAN / ED COURSE  Pertinent labs & imaging results that were available during my care of the patient were reviewed by me and considered in my medical decision making (see chart for details).  Review of the [place] CSRS was performed in accordance of the [facility] prior to dispensing any controlled drugs.   Patient's diagnosis is consistent with dental abscess. Vital signs and exam are reassuring. She is afebrile. No evidence of Ludwig's angina. Patient will be discharged home with prescriptions for amoxicillin. Patient is to follow up with dentist as directed. Patient is given ED precautions to return to the ED for any worsening or new symptoms.   ____________________________________________  FINAL CLINICAL IMPRESSION(S) / ED DIAGNOSES  Final diagnoses:  Dental abscess      NEW MEDICATIONS STARTED DURING THIS VISIT:  Discharge Medication List as of 07/07/2016  9:28 PM    START taking these medications   Details  amoxicillin  (AMOXIL) 500 MG capsule Take 1 capsule (500 mg total) by mouth 3 (three) times daily., Starting Mon 07/07/2016, Print    lidocaine (XYLOCAINE) 2 % solution Use as directed 10 mLs in the mouth or throat as needed for mouth pain., Starting Mon 07/07/2016, Print            This chart was dictated using voice recognition software/Dragon. Despite best efforts to proofread, errors can occur which can change the meaning. Any change was purely unintentional.    Enid Derry, PA-C 07/07/16 2332    Jeanmarie Plant, MD 07/11/16 [phone]

## 2016-07-07 NOTE — Discharge Instructions (Signed)
OPTIONS FOR DENTAL FOLLOW UP CARE ° °[place] Department of Health and Human Services - Local Safety Net Dental Clinics °http://www.ncdhhs.gov/dph/oralhealth/services/safetynetclinics.htm °  °[facility] (336-562-3123) ° °[facility] [place] (919-933-9087) ° °[facility] ([phone] ext [phone]) ° °[facility] ([phone]) ° °[facility] ([phone]) °This clinic caters to the indigent population and is on a lottery system. °Location: °[facility], [address], [place] °Clinic Hours: °Wednesdays from 6pm - 9pm, patients seen by a lottery system. °For dates, call or go to [email or web address].[facility].edu/[facility]/patients/Dental-[facility] °Services: °Cleanings, fillings and simple extractions. °Payment Options: °DENTAL WORK IS FREE OF CHARGE. Bring proof of income or support. °Best way to get seen: °Arrive at 5:15 pm - this is a lottery, NOT first come/first serve, so arriving earlier will not increase your chances of being seen. °  °  °[facility] °[phone] °Select option 1 for emergencies °  °Location: °[facility], [address], [place] °Clinic Hours: °No walk-ins accepted - call the day before to schedule an appointment. °Check in times are 9:30 am and 1:30 pm. °Services: °Simple extractions, temporary fillings, pulpectomy/pulp debridement, uncomplicated abscess drainage. °Payment Options: °PAYMENT IS DUE AT THE TIME OF SERVICE.  Fee is usually $100-200, additional surgical procedures (e.g. abscess drainage) may be extra. °Cash, checks, Visa/MasterCard accepted.  Can file Medicaid if patient is covered for dental - patient should call case worker to check. °No discount for [facility] patients. °Best way to get seen: °MUST call the day before and get onto the schedule. Can usually be seen the next 1-2 days. No walk-ins accepted. °  °  °[facility] °[phone] °   °Location: °[facility], [address], [place] °Clinic Hours: °M, W, Th, F 8am or 1:30pm, Tues 9a or 1:30 - first come/first served. °Services: °Simple extractions, temporary fillings, uncomplicated abscess drainage.  You do not need to be an [place] resident. °Payment Options: °PAYMENT IS DUE AT THE TIME OF SERVICE. °Dental insurance, otherwise sliding scale - bring proof of income or support. °Depending on income and treatment needed, cost is usually $50-200. °Best way to get seen: °Arrive early as it is first come/first served. °  °  °[facility] °[phone] °  °Location: °[address] °Clinic Hours: °Mon-Thu 8a-5p °Services: °Most basic dental services including extractions and fillings. °Payment Options: °PAYMENT IS DUE AT THE TIME OF SERVICE. °Sliding scale, up to 50% off - bring proof if income or support. °Medicaid with dental option accepted. °Best way to get seen: °Call to schedule an appointment, can usually be seen within 2 weeks OR they will try to see walk-ins - show up at 8a or 2p (you may have to wait). °  °  °[facility] °919-245-2435 °[place] RESIDENTS ONLY °  °Location: °[facility], [address], [place], [place] [address] °Clinic Hours: By appointment only. °Monday - Thursday 8am-5pm, Friday 8am-12pm °Services: Cleanings, fillings, extractions. °Payment Options: °PAYMENT IS DUE AT THE TIME OF SERVICE. °Cash, Visa or MasterCard. Sliding scale - $30 minimum per service. °Best way to get seen: °Come in to office, complete packet and make an appointment - need proof of income °or support monies for each household member and proof of [place] residence. °Usually takes about a month to get in. °  °  °[facility] °[phone] °  °Location: °[address]   [place] °Clinic Hours: Walk-in Urgent Care Dental Services are offered Monday-Friday  mornings only. °The numbers of emergencies accepted daily is limited to the number of °providers available. °Maximum 15 - Mondays, Wednesdays & Thursdays °Maximum 10 - Tuesdays & Fridays °Services: °You do not need to be a [place] resident to be seen for a dental emergency. °Emergencies are defined as pain, swelling, abnormal bleeding, or dental trauma. Walkins will receive x-rays if needed. °NOTE: Dental cleaning is not an emergency. °Payment Options: °PAYMENT IS DUE AT THE TIME OF SERVICE. °Minimum co-pay is $40.00 for uninsured patients. °Minimum co-pay is $3.00 for Medicaid with dental coverage. °Dental Insurance is accepted and must be presented at time of visit. °Medicare does not cover dental. °Forms of payment: Cash, credit card, checks. °Best way to get seen: °If not previously registered with the clinic, walk-in dental registration begins at 7:15 am and is on a first come/first serve basis. °If previously registered with the clinic, call to make an appointment. °  °  °[facility] °[phone] °[place] RESIDENTS ONLY °  °Location: °[address], [place], [place] °Clinic Hours: °Mon-Thu 10a-2p °Services: Extractions only! °Payment Options: °FREE (donations accepted) - bring proof of income or support °Best way to get seen: °Call and schedule an appointment OR come at 8am on the 1st Monday of every month (except for holidays) when it is first come/first served. °  °  °[facility] °[phone] °  °Location: °[address], [place] °Clinic Hours: °Friday mornings °Services, Payment Options, Best way to get seen: °Call for info °

## 2016-07-07 NOTE — ED Notes (Signed)
Pt reports right upper tooth pain - pain has been present for 2 days - this is a new pain - pt reports the jaw is swollen but denies any drainage

## 2016-07-07 NOTE — ED Triage Notes (Signed)
Patient ambulatory to triage with steady gait, without difficulty or distress noted; pt reports right sided gum pain with swelling; pt with braces in place

## 2016-08-18 ENCOUNTER — Encounter: Payer: Self-pay | Admitting: *Deleted

## 2016-08-18 DIAGNOSIS — Z5321 Procedure and treatment not carried out due to patient leaving prior to being seen by health care provider: Secondary | ICD-10-CM | POA: Insufficient documentation

## 2016-08-18 DIAGNOSIS — H5711 Ocular pain, right eye: Secondary | ICD-10-CM | POA: Insufficient documentation

## 2016-08-18 NOTE — ED Triage Notes (Signed)
Pt right eye is red, pt reports popping a stye in right eye earlier today, pt denies any other symptoms

## 2016-08-19 ENCOUNTER — Emergency Department
Admission: EM | Admit: 2016-08-19 | Discharge: 2016-08-19 | Disposition: A | Discharge status: Home / Self Care | Payer: Self-pay | Attending: Emergency Medicine | Admitting: Emergency Medicine

## 2016-10-05 ENCOUNTER — Emergency Department
Admission: EM | Admit: 2016-10-05 | Discharge: 2016-10-06 | Disposition: A | Discharge status: Home / Self Care | Payer: Self-pay | Attending: Emergency Medicine | Admitting: Emergency Medicine

## 2016-10-05 ENCOUNTER — Encounter: Payer: Self-pay | Admitting: Emergency Medicine

## 2016-10-05 ENCOUNTER — Emergency Department: Payer: Self-pay

## 2016-10-05 DIAGNOSIS — F17210 Nicotine dependence, cigarettes, uncomplicated: Secondary | ICD-10-CM | POA: Insufficient documentation

## 2016-10-05 DIAGNOSIS — Z79899 Other long term (current) drug therapy: Secondary | ICD-10-CM | POA: Insufficient documentation

## 2016-10-05 DIAGNOSIS — R079 Chest pain, unspecified: Secondary | ICD-10-CM

## 2016-10-05 LAB — BASIC METABOLIC PANEL
Anion gap: 6 (ref 5–15)
BUN: 18 mg/dL (ref 6–20)
CHLORIDE: 107 mmol/L (ref 101–111)
CO2: 28 mmol/L (ref 22–32)
CREATININE: 0.64 mg/dL (ref 0.44–1.00)
Calcium: 9.5 mg/dL (ref 8.9–10.3)
GFR calc non Af Amer: >60 mL/min (ref >60)
GLUCOSE: 78 mg/dL (ref 65–99)
Potassium: 3.4 mmol/L — ABNORMAL LOW (ref 3.5–5.1)
Sodium: 141 mmol/L (ref 135–145)

## 2016-10-05 LAB — CBC WITH DIFFERENTIAL/PLATELET
BASOS PCT: 1 %
Basophils Absolute: 0 10*3/uL (ref 0–0.1)
Eosinophils Absolute: 0.1 10*3/uL (ref 0–0.7)
Eosinophils Relative: 2 %
HEMATOCRIT: 38.8 % (ref 35.0–47.0)
HEMOGLOBIN: 13.1 g/dL (ref 12.0–16.0)
LYMPHS ABS: 3.2 10*3/uL (ref 1.0–3.6)
LYMPHS PCT: 37 %
MCH: 29.8 pg (ref 26.0–34.0)
MCHC: 33.8 g/dL (ref 32.0–36.0)
MCV: 88.1 fL (ref 80.0–100.0)
Monocytes Absolute: 0.5 10*3/uL (ref 0.2–0.9)
Monocytes Relative: 6 %
NEUTROS ABS: 4.8 10*3/uL (ref 1.4–6.5)
NEUTROS PCT: 54 %
Platelets: 289 10*3/uL (ref 150–440)
RBC: 4.41 MIL/uL (ref 3.80–5.20)
RDW: 13.8 % (ref 11.5–14.5)
WBC: 8.7 10*3/uL (ref 3.6–11.0)

## 2016-10-05 LAB — TROPONIN I: Troponin I: <0.03 ng/mL (ref <0.03)

## 2016-10-05 MED ORDER — SUCRALFATE 1 G PO TABS: 1.0000 g | ORAL_TABLET | Freq: Four times a day (QID) | ORAL | 0 refills | Status: DC

## 2016-10-05 MED ORDER — ACETAMINOPHEN 500 MG PO TABS
1000.0000 mg | ORAL_TABLET | Freq: Once | ORAL | Status: AC
  Administered 2016-10-05: 1000 mg via ORAL
  Filled 2016-10-05: qty 2

## 2016-10-05 MED ORDER — GI COCKTAIL ~~LOC~~
30.0000 mL | Freq: Once | ORAL | Status: AC
  Administered 2016-10-05: 30 mL via ORAL
  Filled 2016-10-05: qty 30

## 2016-10-05 MED ORDER — FAMOTIDINE 40 MG PO TABS: 40.0000 mg | ORAL_TABLET | Freq: Every evening | ORAL | 1 refills | Status: DC

## 2016-10-05 NOTE — ED Provider Notes (Signed)
[facility] Emergency Department Provider Note  ____________________________________________   I have reviewed the triage vital signs and the nursing notes.   HISTORY  Chief Complaint Chest pain  History limited by: Not Limited   HPI Haley Shepherd is a 36 y.o. female who presents to the emergency department today via EMS today because of concerns for chest pain. It is located on the left side. It started roughly 30 minutes ago. The patient was driving. She describes it as sharp. It has gradually gotten better. It is worse with movement and deep rest. She denies any traumas. Denies any unusual ingestions. She denies similar symptoms in the past. No recent prolonged travel.    Past Medical History:  Diagnosis Date  . Bartholin cyst   . Depression    postpartum  . Infection    HSV    Patient Active Problem List   Diagnosis Date Noted  . Anemia 01/03/2011  . Normal delivery 01/03/2011  . Twins 01/03/2011  . S/P tubal ligation 01/03/2011    Past Surgical History:  Procedure Laterality Date  . BARTHOLIN GLAND CYST EXCISION    . TUBAL LIGATION  01/02/2011   Procedure: POST PARTUM TUBAL LIGATION;  Surgeon: Esmeralda Arthur, MD;  Location: [facility];  Service: Gynecology;  Laterality: Bilateral;  . VAGINAL DELIVERY  01/01/2011   Procedure: VAGINAL DELIVERY;  Surgeon: Janine Limbo, MD;  Location: [facility];  Service: Gynecology;  Laterality: N/A;    Prior to Admission medications   Medication Sig Start Date End Date Taking? Authorizing Provider  amoxicillin (AMOXIL) 500 MG capsule Take 1 capsule (500 mg total) by mouth 3 (three) times daily. 07/07/16   Enid Derry, PA-C  erythromycin ophthalmic ointment Place 1 application into the left eye 4 (four) times daily. Apply 1cm ribbon to lower eyelid 4 times daily. 01/16/16   Hagler, Jami L, PA-C  gentamicin (GARAMYCIN) 0.3 % ophthalmic solution Place 1 drop into the right eye 3 (three) times daily. 09/06/14    Joni Reining, PA-C  ibuprofen (ADVIL,MOTRIN) 800 MG tablet Take 1 tablet (800 mg total) by mouth every 8 (eight) hours as needed. 07/07/16   Enid Derry, PA-C  ketorolac (TORADOL) 10 MG tablet Take 1 tablet (10 mg total) by mouth every 8 (eight) hours. 09/15/14   Menshew, Charlesetta Ivory, PA-C  lidocaine (XYLOCAINE) 2 % solution Use as directed 10 mLs in the mouth or throat as needed for mouth pain. 07/07/16   Enid Derry, PA-C    Allergies Patient has no known allergies.  Family History  Problem Relation Age of Onset  . Asthma Mother   . Asthma Sister   . Asthma Son     Social History Social History  Substance Use Topics  . Smoking status: Current Every Day Smoker    Packs/day: 0.25  . Smokeless tobacco: Never Used  . Alcohol use Yes    Review of Systems Constitutional: No fever/chills Eyes: No visual changes. ENT: No sore throat. Cardiovascular: Positive for chest pain. Respiratory: Denies shortness of breath. Gastrointestinal: No abdominal pain.  No nausea, no vomiting.  No diarrhea.   Genitourinary: Negative for dysuria. Musculoskeletal: Negative for back pain. Skin: Negative for rash. Neurological: Negative for headaches, focal weakness or numbness.  ____________________________________________   PHYSICAL EXAM:  VITAL SIGNS: ED Triage Vitals  Enc Vitals Group     BP 117/86     Pulse 61     Resp 19     Temp 98.3  Temp src      SpO2 100   Constitutional: Alert and oriented. Well appearing and in no distress. Eyes: Conjunctivae are normal.  ENT   Head: Normocephalic and atraumatic.   Nose: No congestion/rhinnorhea.   Mouth/Throat: Mucous membranes are moist.   Neck: No stridor. Hematological/Lymphatic/Immunilogical: No cervical lymphadenopathy. Cardiovascular: Normal rate, regular rhythm.  No murmurs, rubs, or gallops.  Respiratory: Normal respiratory effort without tachypnea nor retractions. Breath sounds are clear and equal  bilaterally. No wheezes/rales/rhonchi. Gastrointestinal: Soft and non tender. No rebound. No guarding.  Genitourinary: Deferred Musculoskeletal: Normal range of motion in all extremities. No lower extremity edema. Chest wall tender to palpation.  Neurologic:  Normal speech and language. No gross focal neurologic deficits are appreciated.  Skin:  Skin is warm, dry and intact. No rash noted. Psychiatric: Mood and affect are normal. Speech and behavior are normal. Patient exhibits appropriate insight and judgment.  ____________________________________________    LABS (pertinent positives/negatives)  Labs Reviewed  BASIC METABOLIC PANEL - Abnormal; Notable for the following:       Result Value   Potassium 3.4 (*)    All other components within normal limits  CBC WITH DIFFERENTIAL/PLATELET  TROPONIN I     ____________________________________________   EKG  I, Phineas Semen, attending physician, personally viewed and interpreted this EKG  EKG Time: 2138 Rate: 61 Rhythm: sinus rhythm Axis: normal Intervals: qtc 404 QRS: narrow ST changes: no st elevation Impression: normal ekg   ____________________________________________    RADIOLOGY  CXR   IMPRESSION:  Mild bronchitic change of uncertain acuity.   ____________________________________________   PROCEDURES  Procedures  ____________________________________________   INITIAL IMPRESSION / ASSESSMENT AND PLAN / ED COURSE  Pertinent labs & imaging results that were available during my care of the patient were reviewed by me and considered in my medical decision making (see chart for details).  Patient presents to the emergency department today because of concerns for chest pain. Workup here without concerning findings per patient did feel improvement after GI cocktail raising suspicion for possible gastritis or soft enzymes. Patient is perk negative. Low heart score. This point I do not think PE, pneumothorax,  ACS, dissection. Will discharge with sucralfate and Pepcid. Discussed return precautions. Will give primary care follow-up.  ____________________________________________   FINAL CLINICAL IMPRESSION(S) / ED DIAGNOSES  Final diagnoses:  Nonspecific chest pain     Note: This dictation was prepared with Dragon dictation. Any transcriptional errors that result from this process are unintentional     Phineas Semen, MD 10/05/16 [phone]

## 2016-10-05 NOTE — ED Triage Notes (Signed)
Pt bib [facility] d/t left sided chest pain that began while driving, pt states 9/16, worse with inspiration and is reproducible. Pt denies medical hx, denies other sx, denies hx same.

## 2016-10-05 NOTE — ED Notes (Signed)
Pt informed need for urine sample.

## 2016-10-05 NOTE — ED Notes (Addendum)
POC PREGNANCY NEGATIVE

## 2016-10-05 NOTE — ED Notes (Signed)
Recollect green sent  

## 2016-10-05 NOTE — Discharge Instructions (Signed)
Please seek medical attention for any high fevers, chest pain, shortness of breath, change in behavior, persistent vomiting, bloody stool or any other new or concerning symptoms.  

## 2016-10-06 NOTE — ED Notes (Signed)

## 2016-11-06 ENCOUNTER — Emergency Department
Admission: EM | Admit: 2016-11-06 | Discharge: 2016-11-06 | Disposition: A | Discharge status: Home / Self Care | Payer: Self-pay | Attending: Emergency Medicine | Admitting: Emergency Medicine

## 2016-11-06 ENCOUNTER — Encounter: Payer: Self-pay | Admitting: Emergency Medicine

## 2016-11-06 DIAGNOSIS — F17200 Nicotine dependence, unspecified, uncomplicated: Secondary | ICD-10-CM | POA: Insufficient documentation

## 2016-11-06 DIAGNOSIS — Z791 Long term (current) use of non-steroidal anti-inflammatories (NSAID): Secondary | ICD-10-CM | POA: Insufficient documentation

## 2016-11-06 DIAGNOSIS — J020 Streptococcal pharyngitis: Secondary | ICD-10-CM | POA: Insufficient documentation

## 2016-11-06 DIAGNOSIS — Z79899 Other long term (current) drug therapy: Secondary | ICD-10-CM | POA: Insufficient documentation

## 2016-11-06 LAB — POCT RAPID STREP A: Streptococcus, Group A Screen (Direct): POSITIVE — AB

## 2016-11-06 MED ORDER — AMOXICILLIN 500 MG PO CAPS: 500.0000 mg | ORAL_CAPSULE | Freq: Three times a day (TID) | ORAL | 0 refills | Status: DC

## 2016-11-06 MED ORDER — IBUPROFEN 600 MG PO TABS: 600.0000 mg | ORAL_TABLET | Freq: Three times a day (TID) | ORAL | 0 refills | Status: DC | PRN

## 2016-11-06 MED ORDER — MAGIC MOUTHWASH W/LIDOCAINE: 5.0000 mL | Freq: Four times a day (QID) | ORAL | 0 refills | Status: DC

## 2016-11-06 NOTE — ED Triage Notes (Signed)
Pt reports sore throat and headache that began yesterday. Pt reports painful to swallow. Pt speaking in complete sentences without difficulty, respirations even and non-labored, maintaining secretions without difficulty.

## 2016-11-06 NOTE — ED Notes (Addendum)
See triage note  Presents with sore throat which started yesterday    Also had some fever yesterday of 101 states this am she is having increased pain with swallowing  Afebrile on arrival   Also having headache  States pain is mainly at bilateral temporal area

## 2016-11-06 NOTE — ED Provider Notes (Addendum)
[facility] Emergency Department Provider Note   ____________________________________________   First MD Initiated Contact with Patient 11/06/16 1305     (approximate)  I have reviewed the triage vital signs and the nursing notes.   HISTORY  Chief Complaint Sore Throat    HPI Haley Shepherd is a 36 y.o. female complaining of sore throat which began yesterday. Patient states painful swallowing. Patient denies other URI signs symptoms. Patient rates the pain as 8/10. Patient described a pain as "sore". No palliative measures for complaint.   Past Medical History:  Diagnosis Date  . Bartholin cyst   . Depression    postpartum  . Infection    HSV    Patient Active Problem List   Diagnosis Date Noted  . Anemia 01/03/2011  . Normal delivery 01/03/2011  . Twins 01/03/2011  . S/P tubal ligation 01/03/2011    Past Surgical History:  Procedure Laterality Date  . BARTHOLIN GLAND CYST EXCISION    . TUBAL LIGATION  01/02/2011   Procedure: POST PARTUM TUBAL LIGATION;  Surgeon: Esmeralda Arthur, MD;  Location: [facility];  Service: Gynecology;  Laterality: Bilateral;  . VAGINAL DELIVERY  01/01/2011   Procedure: VAGINAL DELIVERY;  Surgeon: Janine Limbo, MD;  Location: [facility];  Service: Gynecology;  Laterality: N/A;    Prior to Admission medications   Medication Sig Start Date End Date Taking? Authorizing Provider  amoxicillin (AMOXIL) 500 MG capsule Take 1 capsule (500 mg total) by mouth 3 (three) times daily. 07/07/16   Enid Derry, PA-C  amoxicillin (AMOXIL) 500 MG capsule Take 1 capsule (500 mg total) by mouth 3 (three) times daily. 11/06/16   Joni Reining, PA-C  erythromycin ophthalmic ointment Place 1 application into the left eye 4 (four) times daily. Apply 1cm ribbon to lower eyelid 4 times daily. 01/16/16   Hagler, Jami L, PA-C  famotidine (PEPCID) 40 MG tablet Take 1 tablet (40 mg total) by mouth every evening. 10/05/16 10/05/17  Phineas Semen, MD  gentamicin (GARAMYCIN) 0.3 % ophthalmic solution Place 1 drop into the right eye 3 (three) times daily. 09/06/14   Joni Reining, PA-C  ibuprofen (ADVIL,MOTRIN) 600 MG tablet Take 1 tablet (600 mg total) by mouth every 8 (eight) hours as needed. 11/06/16   Joni Reining, PA-C  ibuprofen (ADVIL,MOTRIN) 800 MG tablet Take 1 tablet (800 mg total) by mouth every 8 (eight) hours as needed. 07/07/16   Enid Derry, PA-C  ketorolac (TORADOL) 10 MG tablet Take 1 tablet (10 mg total) by mouth every 8 (eight) hours. 09/15/14   Menshew, Charlesetta Ivory, PA-C  lidocaine (XYLOCAINE) 2 % solution Use as directed 10 mLs in the mouth or throat as needed for mouth pain. 07/07/16   Enid Derry, PA-C  magic mouthwash w/lidocaine SOLN Take 5 mLs by mouth 4 (four) times daily. Oral swish and slow swelling. 11/06/16   Joni Reining, PA-C  sucralfate (CARAFATE) 1 g tablet Take 1 tablet (1 g total) by mouth 4 (four) times daily. 10/05/16   Phineas Semen, MD    Allergies Patient has no known allergies.  Family History  Problem Relation Age of Onset  . Asthma Mother   . Asthma Sister   . Asthma Son     Social History Social History  Substance Use Topics  . Smoking status: Current Every Day Smoker    Packs/day: 0.25  . Smokeless tobacco: Never Used  . Alcohol use Yes    Review of Systems  Constitutional: No fever/chills Eyes: No visual changes. ENT: Sore throat Cardiovascular: Denies chest pain. Respiratory: Denies shortness of breath. Gastrointestinal: No abdominal pain.  No nausea, no vomiting.  No diarrhea.  No constipation. Genitourinary: Negative for dysuria. Musculoskeletal: Negative for back pain. Skin: Negative for rash. Neurological: Positive for headaches, but denies focal weakness or numbness.   ____________________________________________   PHYSICAL EXAM:  VITAL SIGNS: ED Triage Vitals  Enc Vitals Group     BP 11/06/16 1215 108/64     Pulse Rate 11/06/16 1215 96       Resp 11/06/16 1215 18     Temp 11/06/16 1215 98.6 F (37 C)     Temp Source 11/06/16 1215 Oral     SpO2 11/06/16 1215 100 %     Weight 11/06/16 1215 102 lb (46.3 kg)     Height 11/06/16 1215  (1.626 m)     Head Circumference --      Peak Flow --      Pain Score 11/06/16 1214 8     Pain Loc --      Pain Edu? --      Excl. in GC? --    Constitutional: Alert and oriented. Well appearing and in no acute distress. Nose: No congestion/rhinnorhea. Mouth/Throat: Mucous membranes are moist.  Oropharynx non-erythematous. Postnasal drainage with Non-exudative tonsils. Neck: No stridor.  Hematological/Lymphatic/Immunilogical: No cervical lymphadenopathy. Cardiovascular: Normal rate, regular rhythm. Grossly normal heart sounds.  Good peripheral circulation. Respiratory: Normal respiratory effort.  No retractions. Lungs CTAB. Gastrointestinal: Soft and nontender. No distention. No abdominal bruits. No CVA tenderness. Neurologic:  Normal speech and language. No gross focal neurologic deficits are appreciated. No gait instability. Skin:  Skin is warm, dry and intact. No rash noted. Psychiatric: Mood and affect are normal. Speech and behavior are normal.  ____________________________________________   LABS (all labs ordered are listed, but only abnormal results are displayed)  Labs Reviewed  POCT RAPID STREP A - Abnormal; Notable for the following:       Result Value   Streptococcus, Group A Screen (Direct) POSITIVE (*)    All other components within normal limits   ____________________________________________  EKG   ____________________________________________  RADIOLOGY  No results found.  ____________________________________________   PROCEDURES  Procedure(s) performed: None  Procedures  Critical Care performed: No  ____________________________________________   INITIAL IMPRESSION / ASSESSMENT AND PLAN / ED COURSE  Pertinent labs & imaging results that were  available during my care of the patient were reviewed by me and considered in my medical decision making (see chart for details).  Sore throat secondary to strep pharyngitis. Patient given discharge care instructions. Patient given a work note. Patient does take medication as directed. Patient [facility]      ____________________________________________   FINAL CLINICAL IMPRESSION(S) / ED DIAGNOSES  Final diagnoses:  Strep throat      NEW MEDICATIONS STARTED DURING THIS VISIT:  New Prescriptions   AMOXICILLIN (AMOXIL) 500 MG CAPSULE    Take 1 capsule (500 mg total) by mouth 3 (three) times daily.   IBUPROFEN (ADVIL,MOTRIN) 600 MG TABLET    Take 1 tablet (600 mg total) by mouth every 8 (eight) hours as needed.   MAGIC MOUTHWASH W/LIDOCAINE SOLN    Take 5 mLs by mouth 4 (four) times daily. Oral swish and slow swelling.     Note:  This document was prepared using Dragon voice recognition software and may include unintentional dictation errors.  Joni Reining, PA-C 11/06/16 1341    Sharman Cheek, MD 11/06/16 1531    Joni Reining, PA-C 11/19/16 1554    Sharman Cheek, MD 11/20/16 [phone]

## 2017-02-26 LAB — HM HIV SCREENING LAB: HM HIV Screening: NEGATIVE

## 2017-04-19 ENCOUNTER — Other Ambulatory Visit: Payer: Self-pay

## 2017-04-19 ENCOUNTER — Emergency Department: Payer: Self-pay

## 2017-04-19 ENCOUNTER — Emergency Department
Admission: EM | Admit: 2017-04-19 | Discharge: 2017-04-19 | Disposition: A | Discharge status: Home / Self Care | Payer: Self-pay | Attending: Emergency Medicine | Admitting: Emergency Medicine

## 2017-04-19 DIAGNOSIS — N83202 Unspecified ovarian cyst, left side: Secondary | ICD-10-CM | POA: Insufficient documentation

## 2017-04-19 DIAGNOSIS — F17200 Nicotine dependence, unspecified, uncomplicated: Secondary | ICD-10-CM | POA: Insufficient documentation

## 2017-04-19 DIAGNOSIS — N83209 Unspecified ovarian cyst, unspecified side: Secondary | ICD-10-CM

## 2017-04-19 LAB — URINALYSIS, COMPLETE (UACMP) WITH MICROSCOPIC
BILIRUBIN URINE: NEGATIVE
Bacteria, UA: NONE SEEN
GLUCOSE, UA: NEGATIVE mg/dL
Hgb urine dipstick: NEGATIVE
KETONES UR: NEGATIVE mg/dL
LEUKOCYTES UA: NEGATIVE
NITRITE: NEGATIVE
PH: 6 (ref 5.0–8.0)
Protein, ur: NEGATIVE mg/dL
Specific Gravity, Urine: 1.024 (ref 1.005–1.030)

## 2017-04-19 LAB — CBC
HCT: 41.6 % (ref 35.0–47.0)
Hemoglobin: 13.8 g/dL (ref 12.0–16.0)
MCH: 29.7 pg (ref 26.0–34.0)
MCHC: 33.3 g/dL (ref 32.0–36.0)
MCV: 89.1 fL (ref 80.0–100.0)
PLATELETS: 201 10*3/uL (ref 150–440)
RBC: 4.67 MIL/uL (ref 3.80–5.20)
RDW: 13.5 % (ref 11.5–14.5)
WBC: 4.1 10*3/uL (ref 3.6–11.0)

## 2017-04-19 LAB — COMPREHENSIVE METABOLIC PANEL
ALT: 13 U/L — AB (ref 14–54)
AST: 21 U/L (ref 15–41)
Albumin: 4.2 g/dL (ref 3.5–5.0)
Alkaline Phosphatase: 69 U/L (ref 38–126)
Anion gap: 8 (ref 5–15)
BUN: 15 mg/dL (ref 6–20)
CALCIUM: 9 mg/dL (ref 8.9–10.3)
CO2: 25 mmol/L (ref 22–32)
CREATININE: 0.69 mg/dL (ref 0.44–1.00)
Chloride: 106 mmol/L (ref 101–111)
Glucose, Bld: 92 mg/dL (ref 65–99)
Potassium: 4 mmol/L (ref 3.5–5.1)
Sodium: 139 mmol/L (ref 135–145)
Total Bilirubin: 0.9 mg/dL (ref 0.3–1.2)
Total Protein: 8 g/dL (ref 6.5–8.1)

## 2017-04-19 LAB — POCT PREGNANCY, URINE: Preg Test, Ur: NEGATIVE

## 2017-04-19 LAB — LIPASE, BLOOD: LIPASE: 42 U/L (ref 11–51)

## 2017-04-19 MED ORDER — HYDROCODONE-ACETAMINOPHEN 5-325 MG PO TABS
1.0000 | ORAL_TABLET | Freq: Once | ORAL | Status: AC
  Administered 2017-04-19: 1 via ORAL
  Filled 2017-04-19: qty 1

## 2017-04-19 MED ORDER — NAPROXEN 500 MG PO TABS: 500.0000 mg | ORAL_TABLET | Freq: Two times a day (BID) | ORAL | 0 refills | Status: AC

## 2017-04-19 NOTE — ED Triage Notes (Signed)
Pt states from time to time she gets L sided pelvic pain. Started this AM at work. Denies vaginal bleeding, discharge, odors. Denies urinary symptoms. Denies hx of kidney stones. Alert, oriented, ambulatory. No distress noted. States pain feels like contraction.

## 2017-04-19 NOTE — ED Notes (Signed)
ED Provider at bedside. 

## 2017-04-19 NOTE — Discharge Instructions (Signed)
Please take your pain medication as needed for severe symptoms and follow-up with primary care as needed.  Return to the emergency department sooner for any new or worsening chills, if you cannot eat or drink, or for any other concerns whatsoever.  It was a pleasure to take care of you today, and thank you for coming to our emergency department.  If you have any questions or concerns before leaving please ask the nurse to grab me and I'm more than happy to go through your aftercare instructions again.  If you were prescribed any opioid pain medication today such as Norco, Vicodin, Percocet, morphine, hydrocodone, or oxycodone please make sure you do not drive when you are taking this medication as it can alter your ability to drive safely.  If you have any concerns once you are home that you are not improving or are in fact getting worse before you can make it to your follow-up appointment, please do not hesitate to call [phone] and come back for further evaluation.  Merrily BrittleNeil Maurice Fotheringham, MD  Results for orders placed or performed during the hospital encounter of 04/19/17  Lipase, blood  Result Value Ref Range   Lipase 42 11 - 51 U/L  Comprehensive metabolic panel  Result Value Ref Range   Sodium 139 135 - 145 mmol/L   Potassium 4.0 3.5 - 5.1 mmol/L   Chloride 106 101 - 111 mmol/L   CO2 25 22 - 32 mmol/L   Glucose, Bld 92 65 - 99 mg/dL   BUN 15 6 - 20 mg/dL   Creatinine, Ser 1.610.69 0.44 - 1.00 mg/dL   Calcium 9.0 8.9 - 09.610.3 mg/dL   Total Protein 8.0 6.5 - 8.1 g/dL   Albumin 4.2 3.5 - 5.0 g/dL   AST 21 15 - 41 U/L   ALT 13 (L) 14 - 54 U/L   Alkaline Phosphatase 69 38 - 126 U/L   Total Bilirubin 0.9 0.3 - 1.2 mg/dL   GFR calc non Af Amer >60 >60 mL/min   GFR calc Af Amer >60 >60 mL/min   Anion gap 8 5 - 15  CBC  Result Value Ref Range   WBC 4.1 3.6 - 11.0 K/uL   RBC 4.67 3.80 - 5.20 MIL/uL   Hemoglobin 13.8 12.0 - 16.0 g/dL   HCT 04.541.6 40.935.0 - 81.147.0 %   MCV 89.1 80.0 - 100.0 fL   MCH 29.7 26.0  - 34.0 pg   MCHC 33.3 32.0 - 36.0 g/dL   RDW 91.413.5 78.211.5 - 95.614.5 %   Platelets 201 150 - 440 K/uL  Urinalysis, Complete w Microscopic  Result Value Ref Range   Color, Urine YELLOW (A) YELLOW   APPearance CLEAR (A) CLEAR   Specific Gravity, Urine 1.024 1.005 - 1.030   pH 6.0 5.0 - 8.0   Glucose, UA NEGATIVE NEGATIVE mg/dL   Hgb urine dipstick NEGATIVE NEGATIVE   Bilirubin Urine NEGATIVE NEGATIVE   Ketones, ur NEGATIVE NEGATIVE mg/dL   Protein, ur NEGATIVE NEGATIVE mg/dL   Nitrite NEGATIVE NEGATIVE   Leukocytes, UA NEGATIVE NEGATIVE   RBC / HPF 0-5 0 - 5 RBC/hpf   WBC, UA 0-5 0 - 5 WBC/hpf   Bacteria, UA NONE SEEN NONE SEEN   Squamous Epithelial / LPF 0-5 (A) NONE SEEN   Mucus PRESENT   Pregnancy, urine POC  Result Value Ref Range   Preg Test, Ur NEGATIVE NEGATIVE   [place] Pelvis Complete  Result Date: 04/19/2017 CLINICAL DATA:  Left lower quadrant pain  for 1 day. EXAM: TRANSABDOMINAL ULTRASOUND OF PELVIS DOPPLER ULTRASOUND OF OVARIES TECHNIQUE: Transabdominal ultrasound examination of the pelvis was performed including evaluation of the uterus, ovaries, adnexal regions, and pelvic cul-de-sac. Color and duplex Doppler ultrasound was utilized to evaluate blood flow to the ovaries. COMPARISON:  None. FINDINGS: Uterus Measurements: 8.5 x 4.3 x 5.8 cm. No fibroids or other mass visualized. Endometrium Thickness: 12 mm.  No focal abnormality visualized. Right ovary Measurements: 2.8 x 1.6 x 1.4 cm. Normal appearance/no adnexal mass. Left ovary Measurements: 3.3 x 2.1 x 2.5 cm. Complicated 1.8 cm cyst. Ovary otherwise unremarkable. No adnexal masses. Trace amount of free fluid adjacent to the left ovary. Pulsed Doppler evaluation demonstrates normal low-resistance arterial and venous waveforms in both ovaries. IMPRESSION: 1. 1.8 cm complicated left ovarian cysts with a trace amount of adjacent pelvic free fluid. This may reflect a recently ruptured follicular cyst and could be the source of left lower  quadrant pain. 2. Exam otherwise within normal limits. Electronically Signed   By: Amie Portland M.D.   On: 04/19/2017 15:12   [place] Art/ven Flow Abd Pelv Doppler  Result Date: 04/19/2017 CLINICAL DATA:  Left lower quadrant pain for 1 day. EXAM: TRANSABDOMINAL ULTRASOUND OF PELVIS DOPPLER ULTRASOUND OF OVARIES TECHNIQUE: Transabdominal ultrasound examination of the pelvis was performed including evaluation of the uterus, ovaries, adnexal regions, and pelvic cul-de-sac. Color and duplex Doppler ultrasound was utilized to evaluate blood flow to the ovaries. COMPARISON:  None. FINDINGS: Uterus Measurements: 8.5 x 4.3 x 5.8 cm. No fibroids or other mass visualized. Endometrium Thickness: 12 mm.  No focal abnormality visualized. Right ovary Measurements: 2.8 x 1.6 x 1.4 cm. Normal appearance/no adnexal mass. Left ovary Measurements: 3.3 x 2.1 x 2.5 cm. Complicated 1.8 cm cyst. Ovary otherwise unremarkable. No adnexal masses. Trace amount of free fluid adjacent to the left ovary. Pulsed Doppler evaluation demonstrates normal low-resistance arterial and venous waveforms in both ovaries. IMPRESSION: 1. 1.8 cm complicated left ovarian cysts with a trace amount of adjacent pelvic free fluid. This may reflect a recently ruptured follicular cyst and could be the source of left lower quadrant pain. 2. Exam otherwise within normal limits. Electronically Signed   By: Amie Portland M.D.   On: 04/19/2017 15:12

## 2017-04-19 NOTE — ED Provider Notes (Signed)
[facility] Emergency Department Provider Note  ____________________________________________   First MD Initiated Contact with Patient 04/19/17 1406     (approximate)  I have reviewed the triage vital signs and the nursing notes.   HISTORY  Chief Complaint No chief complaint on file.   HPI Haley Shepherd is a 37 y.o. female self presents to the emergency department with 1 day of intermittent moderate severity left lower pelvic pain.  The pain is moderate severity nonradiating lasting 2-3 seconds at a time.  Her last menstrual period was roughly 25 days ago.  She has a long-standing history of the same pain and gets that set every several months.  She has never been given a clear diagnosis but she is never had an ultrasound or CT scan.  She denies chest pain or shortness of breath.  She denies fevers or chills.  She denies history of abdominal surgeries.  She drove here today but she is able to get a ride home.  Nothing in particular seems to make the symptoms better or worse.  Past Medical History:  Diagnosis Date  . Bartholin cyst   . Depression    postpartum  . Infection    HSV    Patient Active Problem List   Diagnosis Date Noted  . Anemia 01/03/2011  . Normal delivery 01/03/2011  . Twins 01/03/2011  . S/P tubal ligation 01/03/2011    Past Surgical History:  Procedure Laterality Date  . BARTHOLIN GLAND CYST EXCISION    . TUBAL LIGATION  01/02/2011   Procedure: POST PARTUM TUBAL LIGATION;  Surgeon: Esmeralda Arthur, MD;  Location: [facility];  Service: Gynecology;  Laterality: Bilateral;  . VAGINAL DELIVERY  01/01/2011   Procedure: VAGINAL DELIVERY;  Surgeon: Janine Limbo, MD;  Location: [facility];  Service: Gynecology;  Laterality: N/A;    Prior to Admission medications   Medication Sig Start Date End Date Taking? Authorizing Provider  amoxicillin (AMOXIL) 500 MG capsule Take 1 capsule (500 mg total) by mouth 3 (three) times daily. 07/07/16    Enid Derry, PA-C  amoxicillin (AMOXIL) 500 MG capsule Take 1 capsule (500 mg total) by mouth 3 (three) times daily. 11/06/16   Joni Reining, PA-C  erythromycin ophthalmic ointment Place 1 application into the left eye 4 (four) times daily. Apply 1cm ribbon to lower eyelid 4 times daily. 01/16/16   Hagler, Jami L, PA-C  famotidine (PEPCID) 40 MG tablet Take 1 tablet (40 mg total) by mouth every evening. 10/05/16 10/05/17  Phineas Semen, MD  gentamicin (GARAMYCIN) 0.3 % ophthalmic solution Place 1 drop into the right eye 3 (three) times daily. 09/06/14   Joni Reining, PA-C  ibuprofen (ADVIL,MOTRIN) 600 MG tablet Take 1 tablet (600 mg total) by mouth every 8 (eight) hours as needed. 11/06/16   Joni Reining, PA-C  ibuprofen (ADVIL,MOTRIN) 800 MG tablet Take 1 tablet (800 mg total) by mouth every 8 (eight) hours as needed. 07/07/16   Enid Derry, PA-C  ketorolac (TORADOL) 10 MG tablet Take 1 tablet (10 mg total) by mouth every 8 (eight) hours. 09/15/14   Menshew, Charlesetta Ivory, PA-C  lidocaine (XYLOCAINE) 2 % solution Use as directed 10 mLs in the mouth or throat as needed for mouth pain. 07/07/16   Enid Derry, PA-C  magic mouthwash w/lidocaine SOLN Take 5 mLs by mouth 4 (four) times daily. Oral swish and slow swelling. 11/06/16   Joni Reining, PA-C  naproxen (NAPROSYN) 500 MG tablet Take 1  tablet (500 mg total) by mouth 2 (two) times daily with a meal. 04/19/17 04/19/18  Merrily Brittle, MD  sucralfate (CARAFATE) 1 g tablet Take 1 tablet (1 g total) by mouth 4 (four) times daily. 10/05/16   Phineas Semen, MD    Allergies Patient has no known allergies.  Family History  Problem Relation Age of Onset  . Asthma Mother   . Asthma Sister   . Asthma Son     Social History Social History   Tobacco Use  . Smoking status: Current Every Day Smoker    Packs/day: 0.25  . Smokeless tobacco: Never Used  Substance Use Topics  . Alcohol use: Yes  . Drug use: No    Review of  Systems Constitutional: No fever/chills Eyes: No visual changes. ENT: No sore throat. Cardiovascular: Denies chest pain. Respiratory: Denies shortness of breath. Gastrointestinal: Positive for abdominal pain.  No nausea, no vomiting.  No diarrhea.  No constipation. Genitourinary: Negative for dysuria. Musculoskeletal: Negative for back pain. Skin: Negative for rash. Neurological: Negative for headaches, focal weakness or numbness.   ____________________________________________   PHYSICAL EXAM:  VITAL SIGNS: ED Triage Vitals  Enc Vitals Group     BP 04/19/17 1208 108/60     Pulse Rate 04/19/17 1208 77     Resp 04/19/17 1208 12     Temp 04/19/17 1208 98.7 F (37.1 C)     Temp Source 04/19/17 1208 Oral     SpO2 04/19/17 1208 100 %     Weight 04/19/17 1208 104 lb (47.2 kg)     Height 04/19/17 1208 5\' 4"  (1.626 m)     Head Circumference --      Peak Flow --      Pain Score 04/19/17 1213 6     Pain Loc --      Pain Edu? --      Excl. in GC? --     Constitutional: Alert and oriented x4 appears somewhat uncomfortable nontoxic no diaphoresis speaks in full sentences Eyes: PERRL EOMI. Head: Atraumatic. Nose: No congestion/rhinnorhea. Mouth/Throat: No trismus Neck: No stridor.   Cardiovascular: Normal rate, regular rhythm. Grossly normal heart sounds.  Good peripheral circulation. Respiratory: Normal respiratory effort.  No retractions. Lungs CTAB and moving good air Gastrointestinal: Soft nondistended very mild left lower quadrant/suprapubic pain with no rebound or guarding no peritonitis Musculoskeletal: No lower extremity edema   Neurologic:  Normal speech and language. No gross focal neurologic deficits are appreciated. Skin:  Skin is warm, dry and intact. No rash noted. Psychiatric: Mood and affect are normal. Speech and behavior are normal.    ____________________________________________   DIFFERENTIAL includes but not limited to  Ovarian cyst, ovarian torsion,  appendicitis, diverticulitis, pyelonephritis, ectopic pregnancy ____________________________________________   LABS (all labs ordered are listed, but only abnormal results are displayed)  Labs Reviewed  COMPREHENSIVE METABOLIC PANEL - Abnormal; Notable for the following components:      Result Value   ALT 13 (*)    All other components within normal limits  URINALYSIS, COMPLETE (UACMP) WITH MICROSCOPIC - Abnormal; Notable for the following components:   Color, Urine YELLOW (*)    APPearance CLEAR (*)    Squamous Epithelial / LPF 0-5 (*)    All other components within normal limits  LIPASE, BLOOD  CBC  POC URINE PREG, ED  POCT PREGNANCY, URINE    Lab work reviewed by me shows the patient is not pregnant in no acute disease __________________________________________  EKG   ____________________________________________  RADIOLOGY  Alvin ultrasound reviewed by me shows 1.8 cm left-sided complex ovarian cyst with trace amount of free fluid although good blood flow to the ovaries ____________________________________________   PROCEDURES  Procedure(s) performed: no  Procedures  Critical Care performed: no  Observation: no ____________________________________________   INITIAL IMPRESSION / ASSESSMENT AND PLAN / ED COURSE  Pertinent labs & imaging results that were available during my care of the patient were reviewed by me and considered in my medical decision making (see chart for details).  The patient is hemodynamically stable although is clearly somewhat uncomfortable appearing.  Unclear reason for her intermittent colicky left suprapubic discomfort.  Ultrasound is pending.     The patient's ultrasound shows a 1.8 cm left-sided complex cyst.  This is not large enough to typically cause torsion and she has good flow now.  Her symptoms are likely related to the ruptured cyst.  I had a lengthy discussion with the patient and verbalized the importance for follow-up.   She verbalizes understanding and agree with the plan and is discharged home in improved condition.  Strict return precautions have been given.  ____________________________________________   FINAL CLINICAL IMPRESSION(S) / ED DIAGNOSES  Final diagnoses:  Ruptured ovarian cyst      NEW MEDICATIONS STARTED DURING THIS VISIT:  Discharge Medication List as of 04/19/2017  3:28 PM    START taking these medications   Details  naproxen (NAPROSYN) 500 MG tablet Take 1 tablet (500 mg total) by mouth 2 (two) times daily with a meal., Starting Sun 04/19/2017, Until Mon 04/19/2018, Print         Note:  This document was prepared using Dragon voice recognition software and may include unintentional dictation errors.     Merrily Brittleifenbark, Corrie Reder, MD 04/23/17 1326

## 2017-06-09 ENCOUNTER — Emergency Department
Admission: EM | Admit: 2017-06-09 | Discharge: 2017-06-09 | Disposition: A | Discharge status: Home / Self Care | Payer: Medicaid Other | Attending: Emergency Medicine | Admitting: Emergency Medicine

## 2017-06-09 ENCOUNTER — Other Ambulatory Visit: Payer: Self-pay

## 2017-06-09 ENCOUNTER — Encounter: Payer: Self-pay | Admitting: Emergency Medicine

## 2017-06-09 DIAGNOSIS — Z79899 Other long term (current) drug therapy: Secondary | ICD-10-CM | POA: Insufficient documentation

## 2017-06-09 DIAGNOSIS — Z202 Contact with and (suspected) exposure to infections with a predominantly sexual mode of transmission: Secondary | ICD-10-CM | POA: Insufficient documentation

## 2017-06-09 DIAGNOSIS — N898 Other specified noninflammatory disorders of vagina: Secondary | ICD-10-CM | POA: Insufficient documentation

## 2017-06-09 DIAGNOSIS — F17200 Nicotine dependence, unspecified, uncomplicated: Secondary | ICD-10-CM | POA: Insufficient documentation

## 2017-06-09 LAB — URINALYSIS, COMPLETE (UACMP) WITH MICROSCOPIC
BILIRUBIN URINE: NEGATIVE
Glucose, UA: NEGATIVE mg/dL
Hgb urine dipstick: NEGATIVE
Ketones, ur: NEGATIVE mg/dL
LEUKOCYTES UA: NEGATIVE
Nitrite: NEGATIVE
Protein, ur: NEGATIVE mg/dL
SPECIFIC GRAVITY, URINE: 1.021 (ref 1.005–1.030)
pH: 6 (ref 5.0–8.0)

## 2017-06-09 LAB — CHLAMYDIA/NGC RT PCR (ARMC ONLY)
Chlamydia Tr: DETECTED — AB
N gonorrhoeae: DETECTED — AB

## 2017-06-09 LAB — WET PREP, GENITAL
Sperm: NONE SEEN
Trich, Wet Prep: NONE SEEN
Yeast Wet Prep HPF POC: NONE SEEN

## 2017-06-09 LAB — POCT PREGNANCY, URINE: PREG TEST UR: NEGATIVE

## 2017-06-09 MED ORDER — CEFTRIAXONE SODIUM 250 MG IJ SOLR
250.0000 mg | Freq: Once | INTRAMUSCULAR | Status: AC
  Administered 2017-06-09: 250 mg via INTRAMUSCULAR
  Filled 2017-06-09: qty 250

## 2017-06-09 MED ORDER — AZITHROMYCIN 500 MG PO TABS
1000.0000 mg | ORAL_TABLET | Freq: Once | ORAL | Status: AC
  Administered 2017-06-09: 1000 mg via ORAL
  Filled 2017-06-09: qty 2

## 2017-06-09 MED ORDER — METRONIDAZOLE 500 MG PO TABS
2000.0000 mg | ORAL_TABLET | Freq: Once | ORAL | Status: AC
  Administered 2017-06-09: 2000 mg via ORAL
  Filled 2017-06-09: qty 4

## 2017-06-09 NOTE — ED Provider Notes (Signed)
[facility] Emergency Department Provider Note  ____________________________________________  Time seen: Approximately 6:36 PM  I have reviewed the triage vital signs and the nursing notes.   HISTORY  Chief Complaint Exposure to STD    HPI Haley Shepherd is a 37 y.o. female presents to the emergency department with concern for STD exposure.  Patient reports that she was trying to reconcile her relationship with her husband and when that did not work out, she became sexually active with the father of her children.  Patient reports that at some point, she feels like she contracted a STD.  Patient has had changes in vaginal discharge but no fever or chills.  She denies dysuria or flank pain.  Patient denies any history of STDs.  No dyspareunia.  Patient denies vaginal pruritus or changes in odor.  Past Medical History:  Diagnosis Date  . Bartholin cyst   . Depression    postpartum  . Infection    HSV    Patient Active Problem List   Diagnosis Date Noted  . Anemia 01/03/2011  . Normal delivery 01/03/2011  . Twins 01/03/2011  . S/P tubal ligation 01/03/2011    Past Surgical History:  Procedure Laterality Date  . BARTHOLIN GLAND CYST EXCISION    . TUBAL LIGATION  01/02/2011   Procedure: POST PARTUM TUBAL LIGATION;  Surgeon: Esmeralda Arthur, MD;  Location: [facility];  Service: Gynecology;  Laterality: Bilateral;  . VAGINAL DELIVERY  01/01/2011   Procedure: VAGINAL DELIVERY;  Surgeon: Janine Limbo, MD;  Location: [facility];  Service: Gynecology;  Laterality: N/A;    Prior to Admission medications   Medication Sig Start Date End Date Taking? Authorizing Provider  amoxicillin (AMOXIL) 500 MG capsule Take 1 capsule (500 mg total) by mouth 3 (three) times daily. 07/07/16   Enid Derry, PA-C  amoxicillin (AMOXIL) 500 MG capsule Take 1 capsule (500 mg total) by mouth 3 (three) times daily. 11/06/16   Joni Reining, PA-C  erythromycin ophthalmic ointment Place 1  application into the left eye 4 (four) times daily. Apply 1cm ribbon to lower eyelid 4 times daily. 01/16/16   Hagler, Jami L, PA-C  famotidine (PEPCID) 40 MG tablet Take 1 tablet (40 mg total) by mouth every evening. 10/05/16 10/05/17  Phineas Semen, MD  gentamicin (GARAMYCIN) 0.3 % ophthalmic solution Place 1 drop into the right eye 3 (three) times daily. 09/06/14   Joni Reining, PA-C  ibuprofen (ADVIL,MOTRIN) 600 MG tablet Take 1 tablet (600 mg total) by mouth every 8 (eight) hours as needed. 11/06/16   Joni Reining, PA-C  ibuprofen (ADVIL,MOTRIN) 800 MG tablet Take 1 tablet (800 mg total) by mouth every 8 (eight) hours as needed. 07/07/16   Enid Derry, PA-C  ketorolac (TORADOL) 10 MG tablet Take 1 tablet (10 mg total) by mouth every 8 (eight) hours. 09/15/14   Menshew, Charlesetta Ivory, PA-C  lidocaine (XYLOCAINE) 2 % solution Use as directed 10 mLs in the mouth or throat as needed for mouth pain. 07/07/16   Enid Derry, PA-C  magic mouthwash w/lidocaine SOLN Take 5 mLs by mouth 4 (four) times daily. Oral swish and slow swelling. 11/06/16   Joni Reining, PA-C  naproxen (NAPROSYN) 500 MG tablet Take 1 tablet (500 mg total) by mouth 2 (two) times daily with a meal. 04/19/17 04/19/18  Merrily Brittle, MD  sucralfate (CARAFATE) 1 g tablet Take 1 tablet (1 g total) by mouth 4 (four) times daily.[facility]8/26/18   Derrill Kay,  Dustin Flock, MD    Allergies Patient has no known allergies.  Family History  Problem Relation Age of Onset  . Asthma Mother   . Asthma Sister   . Asthma Son     Social History Social History   Tobacco Use  . Smoking status: Current Every Day Smoker    Packs/day: 0.25  . Smokeless tobacco: Never Used  Substance Use Topics  . Alcohol use: Yes  . Drug use: No     Review of Systems  Constitutional: No fever/chills Eyes: No visual changes. No discharge ENT: No upper respiratory complaints. Cardiovascular: no chest pain. Respiratory: no cough. No SOB. Gastrointestinal: No  abdominal pain.  No nausea, no vomiting.  No diarrhea.  No constipation. Genitourinary: Negative for dysuria. No hematuria Musculoskeletal: Negative for musculoskeletal pain. Skin: Negative for rash, abrasions, lacerations, ecchymosis. Neurological: Negative for headaches, focal weakness or numbness.   ____________________________________________   PHYSICAL EXAM:  VITAL SIGNS: ED Triage Vitals [06/09/17 1726]  Enc Vitals Group     BP 112/60     Pulse Rate 95     Resp 20     Temp 98.3 F (36.8 C)     Temp Source Oral     SpO2 100 %     Weight 102 lb (46.3 kg)     Height  (1.626 m)     Head Circumference      Peak Flow      Pain Score 0     Pain Loc      Pain Edu?      Excl. in GC?      Constitutional: Alert and oriented. Well appearing and in no acute distress. Eyes: Conjunctivae are normal. PERRL. EOMI. Head: Atraumatic. Cardiovascular: Normal rate, regular rhythm. Normal S1 and S2.  Good peripheral circulation. Respiratory: Normal respiratory effort without tachypnea or retractions. Lungs CTAB. Good air entry to the bases with no decreased or absent breath sounds. Gastrointestinal: Bowel sounds 4 quadrants. Soft and nontender to palpation. No guarding or rigidity. No palpable masses. No distention. No CVA tenderness. Genitourinary: Has a small amount of vaginal discharge within the vaginal vault.  No cervical motion tenderness was elicited with bimanual exam. Musculoskeletal: Full range of motion to all extremities. No gross deformities appreciated. Neurologic:  Normal speech and language. No gross focal neurologic deficits are appreciated.  Skin:  Skin is warm, dry and intact. No rash noted.   ____________________________________________   LABS (all labs ordered are listed, but only abnormal results are displayed)  Labs Reviewed  URINALYSIS, COMPLETE (UACMP) WITH MICROSCOPIC - Abnormal; Notable for the following components:      Result Value   Color, Urine  YELLOW (*)    APPearance CLEAR (*)    Bacteria, UA RARE (*)    All other components within normal limits  WET PREP, GENITAL  CHLAMYDIA/NGC RT PCR ([facility] ONLY)  POC URINE PREG, ED  POCT PREGNANCY, URINE   ____________________________________________  EKG   ____________________________________________  RADIOLOGY   No results found.  ____________________________________________    PROCEDURES  Procedure(s) performed:    Procedures    Medications  cefTRIAXone (ROCEPHIN) injection 250 mg (has no administration in time range)  azithromycin (ZITHROMAX) tablet 1,000 mg (1,000 mg Oral Given 06/09/17 1834)  metroNIDAZOLE (FLAGYL) tablet 2,000 mg (2,000 mg Oral Given 06/09/17 1835)     ____________________________________________   INITIAL IMPRESSION / ASSESSMENT AND PLAN / ED COURSE  Pertinent labs & imaging results that were available during my care of the  patient were reviewed by me and considered in my medical decision making (see chart for details).  Review of the [place] CSRS was performed in accordance of the [facility] prior to dispensing any controlled drugs.     Assessment and plan STD exposure Patient presented to the emergency department with concern for STD exposure.  Patient was tested empirically for gonorrhea, chlamydia and trichomoniasis in the ED.  As patient's boyfriend wanted to leave the emergency department, patient elected to be empirically treated in the emergency department for gonorrhea, chlamydia and trichomoniasis.  Patient was advised to follow-up with local health department for a full STD panel. Patient voiced understanding.  Patient education regarding safe sex practices was given.  Vital signs are reassuring prior to discharge.  All patient questions were answered.    ____________________________________________  FINAL CLINICAL IMPRESSION(S) / ED DIAGNOSES  Final diagnoses:  STD exposure      NEW MEDICATIONS STARTED DURING THIS  VISIT:  ED Discharge Orders    None          This chart was dictated using voice recognition software/Dragon. Despite best efforts to proofread, errors can occur which can change the meaning. Any change was purely unintentional.    Orvil Feil, PA-C 06/09/17 1839    Rockne Menghini, MD 06/09/17 [phone]

## 2017-06-09 NOTE — ED Triage Notes (Signed)
Was told by sexual partner to be tested for possible STD  Having some discharge

## 2017-06-11 ENCOUNTER — Telehealth: Payer: Self-pay | Admitting: Emergency Medicine

## 2017-06-11 NOTE — Telephone Encounter (Addendum)
Called patient to inform of std test results.  Was treated durng her ED visit.  Left message   06/15/2017--pt has not called back.  Will send letter.

## 2017-09-20 ENCOUNTER — Emergency Department: Payer: Medicaid Other

## 2017-09-20 ENCOUNTER — Other Ambulatory Visit: Payer: Self-pay

## 2017-09-20 ENCOUNTER — Encounter: Payer: Self-pay | Admitting: Emergency Medicine

## 2017-09-20 ENCOUNTER — Emergency Department
Admission: EM | Admit: 2017-09-20 | Discharge: 2017-09-20 | Disposition: A | Discharge status: Home / Self Care | Payer: Medicaid Other | Attending: Emergency Medicine | Admitting: Emergency Medicine

## 2017-09-20 DIAGNOSIS — B353 Tinea pedis: Secondary | ICD-10-CM

## 2017-09-20 DIAGNOSIS — F17210 Nicotine dependence, cigarettes, uncomplicated: Secondary | ICD-10-CM | POA: Insufficient documentation

## 2017-09-20 DIAGNOSIS — Z79899 Other long term (current) drug therapy: Secondary | ICD-10-CM | POA: Insufficient documentation

## 2017-09-20 LAB — GLUCOSE, CAPILLARY: Glucose-Capillary: 77 mg/dL (ref 70–99)

## 2017-09-20 MED ORDER — TERBINAFINE HCL 250 MG PO TABS: 250.0000 mg | ORAL_TABLET | Freq: Every day | ORAL | 0 refills | Status: AC

## 2017-09-20 MED ORDER — CEPHALEXIN 500 MG PO CAPS: 500.0000 mg | ORAL_CAPSULE | Freq: Four times a day (QID) | ORAL | 0 refills | Status: AC

## 2017-09-20 NOTE — ED Triage Notes (Addendum)
Presents with left foot pain  Denies any injury  Abrasions noted to bottom of foot  States area started out as atheles foot and now area is worse

## 2017-09-20 NOTE — ED Notes (Signed)
I explained crutch use to the patient. She was able to walk around the flex area without any issues.

## 2017-09-20 NOTE — ED Provider Notes (Signed)
[facility] Emergency Department Provider Note  ____________________________________________  Time seen: Approximately 2:23 PM  I have reviewed the triage vital signs and the nursing notes.   HISTORY  Chief Complaint Foot Pain    HPI Haley Shepherd is a 37 y.o. female that presents to emergency department for evaluation athlete's foot.  Patient states that she noticed athlete's foot on her left foot about a week ago.  She has not been using topical medications but symptoms are worsening.  She now has a wound on the bottom of her foot.  It is minimally painful.  No fever, chills.  Past Medical History:  Diagnosis Date  . Bartholin cyst   . Depression    postpartum  . Infection    HSV    Patient Active Problem List   Diagnosis Date Noted  . Anemia 01/03/2011  . Normal delivery 01/03/2011  . Twins 01/03/2011  . S/P tubal ligation 01/03/2011    Past Surgical History:  Procedure Laterality Date  . BARTHOLIN GLAND CYST EXCISION    . TUBAL LIGATION  01/02/2011   Procedure: POST PARTUM TUBAL LIGATION;  Surgeon: Esmeralda ArthurSandra A Rivard, MD;  Location: [facility];  Service: Gynecology;  Laterality: Bilateral;  . VAGINAL DELIVERY  01/01/2011   Procedure: VAGINAL DELIVERY;  Surgeon: Janine LimboArthur V Stringer, MD;  Location: [facility];  Service: Gynecology;  Laterality: N/A;    Prior to Admission medications   Medication Sig Start Date End Date Taking? Authorizing Provider  amoxicillin (AMOXIL) 500 MG capsule Take 1 capsule (500 mg total) by mouth 3 (three) times daily. 07/07/16   Enid DerryWagner, Stormey Wilborn, PA-C  amoxicillin (AMOXIL) 500 MG capsule Take 1 capsule (500 mg total) by mouth 3 (three) times daily. 11/06/16   Joni ReiningSmith, Ronald K, PA-C  cephALEXin (KEFLEX) 500 MG capsule Take 1 capsule (500 mg total) by mouth 4 (four) times daily for 10 days. 09/20/17 09/30/17  Enid DerryWagner, Jobin Montelongo, PA-C  erythromycin ophthalmic ointment Place 1 application into the left eye 4 (four) times daily. Apply 1cm ribbon  to lower eyelid 4 times daily. 01/16/16   Hagler, Jami L, PA-C  famotidine (PEPCID) 40 MG tablet Take 1 tablet (40 mg total) by mouth every evening. 10/05/16 10/05/17  Phineas SemenGoodman, Graydon, MD  gentamicin (GARAMYCIN) 0.3 % ophthalmic solution Place 1 drop into the right eye 3 (three) times daily. 09/06/14   Joni ReiningSmith, Ronald K, PA-C  ibuprofen (ADVIL,MOTRIN) 600 MG tablet Take 1 tablet (600 mg total) by mouth every 8 (eight) hours as needed. 11/06/16   Joni ReiningSmith, Ronald K, PA-C  ibuprofen (ADVIL,MOTRIN) 800 MG tablet Take 1 tablet (800 mg total) by mouth every 8 (eight) hours as needed. 07/07/16   Enid DerryWagner, Kanai Hilger, PA-C  ketorolac (TORADOL) 10 MG tablet Take 1 tablet (10 mg total) by mouth every 8 (eight) hours. 09/15/14   Menshew, Charlesetta IvoryJenise V Bacon, PA-C  lidocaine (XYLOCAINE) 2 % solution Use as directed 10 mLs in the mouth or throat as needed for mouth pain. 07/07/16   Enid DerryWagner, Luisantonio Adinolfi, PA-C  magic mouthwash w/lidocaine SOLN Take 5 mLs by mouth 4 (four) times daily. Oral swish and slow swelling. 11/06/16   Joni ReiningSmith, Ronald K, PA-C  naproxen (NAPROSYN) 500 MG tablet Take 1 tablet (500 mg total) by mouth 2 (two) times daily with a meal. 04/19/17 04/19/18  Merrily Brittleifenbark, Neil, MD  sucralfate (CARAFATE) 1 g tablet Take 1 tablet (1 g total) by mouth 4 (four) times daily. 10/05/16   Phineas SemenGoodman, Graydon, MD  terbinafine (LAMISIL) 250 MG tablet Take 1  tablet (250 mg total) by mouth daily for 14 days. 09/20/17 10/04/17  Enid Derry, PA-C    Allergies Patient has no known allergies.  Family History  Problem Relation Age of Onset  . Asthma Mother   . Asthma Sister   . Asthma Son     Social History Social History   Tobacco Use  . Smoking status: Current Every Day Smoker    Packs/day: 0.25  . Smokeless tobacco: Never Used  Substance Use Topics  . Alcohol use: Yes  . Drug use: No     Review of Systems  Constitutional: No fever/chills Gastrointestinal: No abdominal pain.  No nausea, no vomiting.  Musculoskeletal: Positive for foot  pain. Skin: Negative for rash, ecchymosis. Neurological: Negative for numbness or tingling   ____________________________________________   PHYSICAL EXAM:  VITAL SIGNS: ED Triage Vitals  Enc Vitals Group     BP 09/20/17 0940 107/65     Pulse Rate 09/20/17 0940 71     Resp 09/20/17 0940 18     Temp 09/20/17 0940 98 F (36.7 C)     Temp Source 09/20/17 0940 Oral     SpO2 09/20/17 0940 98 %     Weight 09/20/17 0941 100 lb (45.4 kg)     Height 09/20/17 0941 5\' 4"  (1.626 m)     Head Circumference --      Peak Flow --      Pain Score 09/20/17 1231 2     Pain Loc --      Pain Edu? --      Excl. in GC? --      Constitutional: Alert and oriented. Well appearing and in no acute distress. Eyes: Conjunctivae are normal. PERRL. EOMI. Head: Atraumatic. ENT:      Ears:      Nose: No congestion/rhinnorhea.      Mouth/Throat: Mucous membranes are moist.  Neck: No stridor.  Cardiovascular: Normal rate, regular rhythm.  Good peripheral circulation. Respiratory: Normal respiratory effort without tachypnea or retractions. Lungs CTAB. Good air entry to the bases with no decreased or absent breath sounds. Musculoskeletal: Full range of motion to all extremities. No gross deformities appreciated. Neurologic:  Normal speech and language. No gross focal neurologic deficits are appreciated.  Skin:  Skin is warm, dry and intact.  White crusting between all toes and to plantar distal foot.  Shallow wounds to bottom of left foot. No drainage. No surrounding erythema. No malodor.  No tenderness to palpation. Psychiatric: Mood and affect are normal. Speech and behavior are normal. Patient exhibits appropriate insight and judgement.   ____________________________________________   LABS (all labs ordered are listed, but only abnormal results are displayed)  Labs Reviewed  GLUCOSE, CAPILLARY  CBG MONITORING, ED    ____________________________________________  EKG   ____________________________________________  RADIOLOGY Lexine Baton, personally viewed and evaluated these images (plain radiographs) as part of my medical decision making, as well as reviewing the written report by the radiologist.  Dg Foot Complete Left  Result Date: 09/20/2017 CLINICAL DATA:  Left foot pain EXAM: LEFT FOOT - COMPLETE 3+ VIEW COMPARISON:  None. FINDINGS: No fracture or dislocation is seen. The joint spaces are preserved. Visualized soft tissues are within normal limits. IMPRESSION: Negative. Electronically Signed   By: Charline Bills M.D.   On: 09/20/2017 11:27    ____________________________________________    PROCEDURES  Procedure(s) performed:    Procedures    Medications - No data to display   ____________________________________________   INITIAL IMPRESSION / ASSESSMENT  AND PLAN / ED COURSE  Pertinent labs & imaging results that were available during my care of the patient were reviewed by me and considered in my medical decision making (see chart for details).  Review of the [place] CSRS was performed in accordance of the [facility] prior to dispensing any controlled drugs.     Patient's diagnosis is consistent with athletes foot.  Vital signs and exam are reassuring.  X-ray negative for bony abnormality.  Symptoms are consistent with athlete's foot.  She will be given antibiotics to prevent secondary infection.  She was instructed to keep her foot wrapped and pressure off of foot and use crutches.  Patient will be discharged home with prescriptions for Keflex and Lamisil. Patient is to follow up with podiatry as directed. Patient is given ED precautions to return to the ED for any worsening or new symptoms.     ____________________________________________  FINAL CLINICAL IMPRESSION(S) / ED DIAGNOSES  Final diagnoses:  Tinea pedis of left foot      NEW MEDICATIONS STARTED DURING  THIS VISIT:  ED Discharge Orders         Ordered    terbinafine (LAMISIL) 250 MG tablet  Daily     09/20/17 1207    cephALEXin (KEFLEX) 500 MG capsule  4 times daily     09/20/17 1207              This chart was dictated using voice recognition software/Dragon. Despite best efforts to proofread, errors can occur which can change the meaning. Any change was purely unintentional.    Enid Derry, PA-C 09/20/17 1517    Arnaldo Natal, MD 09/20/17 [phone]

## 2018-08-25 ENCOUNTER — Telehealth: Payer: Self-pay | Admitting: Licensed Clinical Social Worker

## 2018-08-25 NOTE — Telephone Encounter (Addendum)
Patient left vm for LCSW requesting to schedule appt. LCSW returned call and spoke with patient. Patient reports that she needs appt. Due to DSS case and reports history of PTSD. LCSW informed patient that she would need to call back next week because the August schedule is not in the system. Patient was in agreement with calling back next week.

## 2018-08-25 NOTE — Telephone Encounter (Signed)
Ginny Forth from [facility] [employer] sent LCSW a e-mail requesting confirmation of patient receiving services.   LCSW called Ms. Mapp back and informed her that we do not have an ROI on file and LCSW checked back from previous communication in April that we do not have an ROI and therefore cannot release any information.   LCSW informed Ms. Mapp that she is only accepting new patient's one day per week at this time.

## 2019-06-13 ENCOUNTER — Ambulatory Visit: Payer: Self-pay | Admitting: Family Medicine

## 2019-06-13 ENCOUNTER — Ambulatory Visit: Payer: Self-pay

## 2019-06-13 ENCOUNTER — Encounter: Payer: Self-pay | Admitting: Family Medicine

## 2019-06-13 ENCOUNTER — Other Ambulatory Visit: Payer: Self-pay

## 2019-06-13 DIAGNOSIS — Z113 Encounter for screening for infections with a predominantly sexual mode of transmission: Secondary | ICD-10-CM

## 2019-06-13 DIAGNOSIS — B9689 Other specified bacterial agents as the cause of diseases classified elsewhere: Secondary | ICD-10-CM

## 2019-06-13 DIAGNOSIS — A599 Trichomoniasis, unspecified: Secondary | ICD-10-CM

## 2019-06-13 DIAGNOSIS — N760 Acute vaginitis: Secondary | ICD-10-CM

## 2019-06-13 LAB — WET PREP FOR TRICH, YEAST, CLUE
Trichomonas Exam: POSITIVE — AB
Yeast Exam: NEGATIVE

## 2019-06-13 MED ORDER — METRONIDAZOLE 500 MG PO TABS: 2000.0000 mg | ORAL_TABLET | Freq: Once | ORAL | 0 refills | Status: AC

## 2019-06-13 MED ORDER — METRONIDAZOLE 0.75 % VA GEL
1.0000 | Freq: Every day | VAGINAL | 0 refills | Status: AC
Start: 2019-06-13 — End: 2019-06-18

## 2019-06-13 NOTE — Progress Notes (Signed)
Wet mount reviewed with provider; pt to take trich tx today (4 tablets Metronidazole 500 mg), and start BV tx with Metronidazole Gel 06/14/2019. Provider orders completed.

## 2019-06-13 NOTE — Progress Notes (Signed)
[facility] Department STI clinic/screening visit  Subjective:  Haley Shepherd is a 39 y.o. female being seen today for  Chief Complaint  Patient presents with  . SEXUALLY TRANSMITTED DISEASE    STD screening including bloodwork     The patient reports they do have symptoms. Patient reports that they do not desire a pregnancy in the next year. They reported they are not interested in discussing contraception today.   Patient has the following medical conditions:   Patient Active Problem List   Diagnosis Date Noted  . Anemia 01/03/2011  . Twins 01/03/2011  . S/P tubal ligation 01/03/2011    HPI  Pt reports she has been having yellow discharge w/odor, occ abd pain x2 wks. Last sex was 4 months ago. She was diagnosed w/BV by Urgent Care, unable to take pills by mouth.   See flowsheet for further details and programmatic requirements.    No LMP recorded. Last sex: 4 months ago BCM: BTL Desires EC? n/a   No components found for: HCV  The following portions of the patient's history were reviewed and updated as appropriate: allergies, current medications, past medical history, past social history, past surgical history and problem list.  Objective:  There were no vitals filed for this visit.   Physical Exam Vitals and nursing note reviewed.  Constitutional:      Appearance: Normal appearance.  HENT:     Head: Normocephalic and atraumatic.     Mouth/Throat:     Mouth: Mucous membranes are moist.     Pharynx: Oropharynx is clear. No oropharyngeal exudate or posterior oropharyngeal erythema.  Pulmonary:     Effort: Pulmonary effort is normal.  Abdominal:     General: Abdomen is flat.     Palpations: There is no mass.     Tenderness: There is no abdominal tenderness. There is no rebound.  Genitourinary:    General: Normal vulva.     Exam position: Lithotomy position.     Pubic Area: No rash or pubic lice.      Labia:        Right: No rash or lesion.      Left: No rash or lesion.      Vagina: Vaginal discharge (white, creamy, ph>4.5) present. No erythema, bleeding or lesions.     Cervix: No cervical motion tenderness, discharge, friability, lesion or erythema.     Uterus: Normal.      Adnexa: Right adnexa normal and left adnexa normal.     Rectum: Normal.  Lymphadenopathy:     Head:     Right side of head: No preauricular or posterior auricular adenopathy.     Left side of head: No preauricular or posterior auricular adenopathy.     Cervical: No cervical adenopathy.     Upper Body:     Right upper body: No supraclavicular or axillary adenopathy.     Left upper body: No supraclavicular or axillary adenopathy.     Lower Body: No right inguinal adenopathy. No left inguinal adenopathy.  Skin:    General: Skin is warm and dry.     Findings: No rash.  Neurological:     Mental Status: She is alert and oriented to person, place, and time.      Assessment and Plan:  Haley Shepherd is a 39 y.o. female presenting to the [facility] Department for STI screening   1. Screening examination for venereal disease -Pt with symptoms. Screenings today as below. Treat wet prep per standing  order. If BV is present will tx with metrogel d/t pt's past intolerance of metronidazole PO. -Patient does not meet criteria for HepB, HepC Screening.  -Counseled on warning s/sx and when to seek care. Recommended condom use with all sex and discussed importance of condom use for STI prevention. - WET PREP FOR TRICH, YEAST, CLUE - Chlamydia/Gonorrhea [facility] - HIV [facility] - Syphilis Serology, [facility]  2. Bacterial vaginosis -Wet prep + for BV, treatment as below d/t difficulty taking pills: - metroNIDAZOLE (VANDAZOLE) 0.75 % vaginal gel; Place 1 Applicatorful vaginally at bedtime for 5 days.  Dispense: 70 g; Refill: 0  3. Trichimoniasis -Wet prep + for trich, RN treated per standing order as below.  - metroNIDAZOLE (FLAGYL) 500 MG  tablet; Take 4 tablets (2,000 mg total) by mouth once for 1 dose.  Dispense: 4 tablet; Refill: 0    Return if symptoms worsen or fail to improve.  No future appointments.  Ann Held, PA-C

## 2019-07-05 ENCOUNTER — Other Ambulatory Visit: Payer: Medicaid Other

## 2019-09-23 ENCOUNTER — Other Ambulatory Visit: Payer: Medicaid Other

## 2019-09-23 ENCOUNTER — Other Ambulatory Visit: Payer: Self-pay

## 2019-09-23 ENCOUNTER — Ambulatory Visit (LOCAL_COMMUNITY_HEALTH_CENTER): Payer: Medicaid Other

## 2019-09-23 DIAGNOSIS — Z111 Encounter for screening for respiratory tuberculosis: Secondary | ICD-10-CM

## 2019-09-26 ENCOUNTER — Ambulatory Visit (LOCAL_COMMUNITY_HEALTH_CENTER): Payer: Medicaid Other

## 2019-09-26 ENCOUNTER — Other Ambulatory Visit: Payer: Self-pay

## 2019-09-26 DIAGNOSIS — Z111 Encounter for screening for respiratory tuberculosis: Secondary | ICD-10-CM

## 2019-09-26 LAB — TB SKIN TEST
Induration: 0 mm
TB Skin Test: NEGATIVE

## 2019-10-26 ENCOUNTER — Other Ambulatory Visit: Payer: Self-pay

## 2019-10-26 ENCOUNTER — Ambulatory Visit: Payer: Self-pay | Admitting: Family Medicine

## 2019-10-26 ENCOUNTER — Encounter: Payer: Self-pay | Admitting: Family Medicine

## 2019-10-26 DIAGNOSIS — Z113 Encounter for screening for infections with a predominantly sexual mode of transmission: Secondary | ICD-10-CM

## 2019-10-26 DIAGNOSIS — Z299 Encounter for prophylactic measures, unspecified: Secondary | ICD-10-CM

## 2019-10-26 MED ORDER — METRONIDAZOLE 500 MG PO TABS: 2000.0000 mg | ORAL_TABLET | Freq: Once | ORAL | 0 refills | Status: AC

## 2019-10-26 NOTE — Progress Notes (Signed)
  [facility] Department STI clinic/screening visit  Subjective:  Haley Shepherd is a 39 y.o. female being seen today for an STI screening visit. The patient reports they do have symptoms.  Patient reports that they do not desire a pregnancy in the next year.   They reported they are not interested in discussing contraception today.  Patient's last menstrual period was 09/28/2019.   Patient has the following medical conditions:   Patient Active Problem List   Diagnosis Date Noted  . Anemia 01/03/2011  . Twins 01/03/2011  . S/P tubal ligation 01/03/2011    Chief Complaint  Patient presents with  . SEXUALLY TRANSMITTED DISEASE    HPI  Patient reports that she has had light green discharge with odor x 2 days and has a "cut or  bump" on her R labia that stings when she wipes.  Last HIV test per patient/review of record was 2019 Patient reports last pap was unsure but is due for one.   See flowsheet for further details and programmatic requirements.    The following portions of the patient's history were reviewed and updated as appropriate: allergies, current medications, past medical history, past social history, past surgical history and problem list.  Objective:  There were no vitals filed for this visit.  Physical Exam Constitutional:      Appearance: Normal appearance. She is normal weight.  Genitourinary:    General: Normal vulva.     Pubic Area: No rash or pubic lice.      Labia:        Right: Lesion present. No rash.        Left: No rash, tenderness or lesion.      Vagina: Vaginal discharge present.     Cervix: No discharge, friability, lesion or erythema.     Comments: Thin yellow disch, large amt, fishy odor noted, pH >4.5 R labia minor- 1 cm x0 .5cm, erythematous flat lesion, no  disch noted, slight tender with specimen collection. Bimanual deferred  Lymphadenopathy:     Head:     Right side of head: No submandibular, tonsillar or occipital  adenopathy.     Left side of head: No submandibular, tonsillar or occipital adenopathy.     Cervical: No cervical adenopathy.     Upper Body:     Right upper body: No supraclavicular or axillary adenopathy.     Left upper body: No axillary adenopathy.     Lower Body: No right inguinal adenopathy. No left inguinal adenopathy.    Assessment and Plan:  Haley Shepherd is a 39 y.o. female presenting to the [facility] Department for STI screening  1. Screening examination for venereal disease Treat wet prep as per SO. - WET PREP FOR TRICH, YEAST, CLUE - Gonococcus culture - Chlamydia/Gonorrhea [facility] - Virology, [facility]  -R/O HSV 2- discussed with client to wait for test results.  If + for HSV 2 offer outbreak treatment or preventive. Co to use condoms until test results or abstain.  2. Prophylactic Treatment Treat for Trich based on symptoms and exam findings. Metronidazole 2 gm po once Co to abstain from sex x 1 week.  Return if symptoms worsen or fail to improve.  No future appointments.  Larene Pickett, FNP

## 2019-10-26 NOTE — Progress Notes (Signed)
Wet mount reviewed with Larene Pickett, FNP and is negative today. Pt received prophylactic treatment with Metronidazole 2g per Larene Pickett, FNP verbal order based on pt's symptoms. Counseled pt per provider orders and pt states understanding. Provider orders completed.

## 2019-10-27 LAB — WET PREP FOR TRICH, YEAST, CLUE
Trichomonas Exam: NEGATIVE
Yeast Exam: NEGATIVE

## 2019-10-28 ENCOUNTER — Ambulatory Visit: Payer: Medicaid Other

## 2019-10-29 ENCOUNTER — Encounter: Payer: Self-pay | Admitting: Family Medicine

## 2019-10-31 LAB — GONOCOCCUS CULTURE

## 2019-11-03 ENCOUNTER — Encounter: Payer: Self-pay | Admitting: Family Medicine

## 2019-11-04 NOTE — Telephone Encounter (Signed)
Call to patient to discuss virology test results.  Patient verbalized understanding. Patient reports no other questions at this time. Wendi Snipes, RN

## 2020-06-11 ENCOUNTER — Emergency Department
Admission: EM | Admit: 2020-06-11 | Discharge: 2020-06-11 | Disposition: A | Discharge status: Home / Self Care | Payer: Medicaid Other

## 2020-06-13 ENCOUNTER — Emergency Department
Admission: EM | Admit: 2020-06-13 | Discharge: 2020-06-13 | Disposition: A | Discharge status: Home / Self Care | Payer: Medicaid Other | Attending: Emergency Medicine | Admitting: Emergency Medicine

## 2020-06-13 ENCOUNTER — Other Ambulatory Visit: Payer: Self-pay

## 2020-06-13 DIAGNOSIS — F17210 Nicotine dependence, cigarettes, uncomplicated: Secondary | ICD-10-CM | POA: Insufficient documentation

## 2020-06-13 DIAGNOSIS — K1379 Other lesions of oral mucosa: Secondary | ICD-10-CM | POA: Insufficient documentation

## 2020-06-13 NOTE — ED Provider Notes (Signed)
[facility] Emergency Department Provider Note  ____________________________________________   Event Date/Time   First MD Initiated Contact with Patient 06/13/20 [phone]     (approximate)  I have reviewed the triage vital signs and the nursing notes.   HISTORY  Chief Complaint Oral Pain   HPI Haley Shepherd is a 40 y.o. female presents to the ED with complaint of her orthodontic bracket rubbing her gums and oral mucosa causing it to be very painful and decreased eating due to the pain.  She states that this began to days ago.  She reports that she moved from [place] to this area and does not have an Associate Professor.  She did go to a general dentist who wrote her prescription for mouthwash.  She rates her pain as an 8 out of 10.     Past Medical History:  Diagnosis Date  . Bartholin cyst   . Depression    postpartum  . Infection    HSV    Patient Active Problem List   Diagnosis Date Noted  . Anemia 01/03/2011  . Twins 01/03/2011  . S/P tubal ligation 01/03/2011    Past Surgical History:  Procedure Laterality Date  . BARTHOLIN GLAND CYST EXCISION    . TUBAL LIGATION  01/02/2011   Procedure: POST PARTUM TUBAL LIGATION;  Surgeon: Haley Arthur, MD;  Location: [facility];  Service: Gynecology;  Laterality: Bilateral;  . VAGINAL DELIVERY  01/01/2011   Procedure: VAGINAL DELIVERY;  Surgeon: Haley Limbo, MD;  Location: [facility];  Service: Gynecology;  Laterality: N/A;    Prior to Admission medications   Not on File    Allergies Patient has no known allergies.  Family History  Problem Relation Age of Onset  . Asthma Mother   . Asthma Sister   . Asthma Son     Social History Social History   Tobacco Use  . Smoking status: Current Every Day Smoker    Packs/day: 0.25    Types: Cigarettes  . Smokeless tobacco: Never Used  Vaping Use  . Vaping Use: Never used  Substance Use Topics  . Alcohol use: Yes    Alcohol/week: 1.0 standard drink     Types: 1 Shots of liquor per week  . Drug use: No    Review of Systems Constitutional: No fever/chills Eyes: No visual changes. ENT: No sore throat.  Positive for oral pain secondary to orthodontic bracket Cardiovascular: Denies chest pain. Respiratory: Denies shortness of breath. Gastrointestinal: No abdominal pain.  No nausea, no vomiting.   Musculoskeletal: Negative for muscle skeletal pain Skin: Negative for rash. Neurological: Negative for headaches, focal weakness or numbness. ____________________________________________   PHYSICAL EXAM:  VITAL SIGNS: ED Triage Vitals  Enc Vitals Group     BP 06/13/20 0806 117/75     Pulse Rate 06/13/20 0806 67     Resp 06/13/20 0806 18     Temp 06/13/20 0806 98.8 F (37.1 C)     Temp Source 06/13/20 0806 Oral     SpO2 06/13/20 0806 100 %     Weight 06/13/20 0806 104 lb (47.2 kg)     Height 06/13/20 0806 5\' 4"  (1.626 m)     Head Circumference --      Peak Flow --      Pain Score 06/13/20 0806 8     Pain Loc --      Pain Edu? --      Excl. in GC? --     Constitutional:  Alert and oriented. Well appearing and in no acute distress. Eyes: Conjunctivae are normal.  Head: Atraumatic. Nose: No congestion/rhinnorhea. Mouth/Throat: Mucous membranes are moist.  Oropharynx non-erythematous.  Brackets are present on teeth both upper and lower.  The wires have been removed by the patient.  No open wounds are noted to the buccal mucosa. Neck: No stridor.   Respiratory: Lungs clear bilaterally.  No retractions.  Cardiovascular: Normal rate, regular rhythm. Grossly normal heart sounds.  Good peripheral circulation. Respiratory: Normal respiratory effort.  No retractions. Neurologic:  Normal speech and language. No gross focal neurologic deficits are appreciated.  Skin:  Skin is warm, dry and intact.  Psychiatric: Mood and affect are normal. Speech and behavior are normal.  ____________________________________________   LABS (all labs  ordered are listed, but only abnormal results are displayed)  Labs Reviewed - No data to display   PROCEDURES  Procedure(s) performed (including Critical Care):  Procedures   ____________________________________________   INITIAL IMPRESSION / ASSESSMENT AND PLAN / ED COURSE  As part of my medical decision making, I reviewed the following data within the electronic MEDICAL RECORD NUMBER Notes from prior ED visits and [place] Controlled Substance Database  40 year old female presents to the ED with complaint of oral pain from the brackets that are on her teeth.  Patient states she moved from [place] to [place] does not have an orthodontist down here.  She has taken the wires off due to pain and now the brackets are pressing against the buccal mucosa of her mouth.  There is no open wounds or bleeding noted on exam.  Was made aware that the orthodontic wax is over-the-counter and where she can purchase it.  She is encouraged to get an orthodontist in this area and continue with her alignment or have the brackets removed.   ____________________________________________   FINAL CLINICAL IMPRESSION(S) / ED DIAGNOSES  Final diagnoses:  Mucosal irritation of oral cavity     ED Discharge Orders    None      *Please note:  Haley Shepherd was evaluated in Emergency Department on 06/13/2020 for the symptoms described in the history of present illness. She was evaluated in the context of the global COVID-19 pandemic, which necessitated consideration that the patient might be at risk for infection with the SARS-CoV-2 virus that causes COVID-19. Institutional protocols and algorithms that pertain to the evaluation of patients at risk for COVID-19 are in a state of rapid change based on information released by regulatory bodies including the CDC and federal and state organizations. These policies and algorithms were followed during the patient's care in the ED.  Some ED evaluations and interventions  may be delayed as a result of limited staffing during and the pandemic.*   Note:  This document was prepared using Dragon voice recognition software and may include unintentional dictation errors.    Tommi Rumps, PA-C 06/13/20 1036    Chesley Noon, MD 06/14/20 [phone]

## 2020-06-13 NOTE — ED Notes (Signed)
Signature pad not available, pt verbalizes understanding of d/c instructions, denies questions or concerns 

## 2020-06-13 NOTE — ED Triage Notes (Signed)
Pt states she is having pain from the orthodontic bracket that is rubbing on her gums and has not been able to eat anything for the past 2 days

## 2020-06-13 NOTE — Discharge Instructions (Addendum)
Obtain orthodontic wax and applied to the 2 brackets that are causing problems.  [facility] and [facility] both had this product when googled on the Internet.  There are no mouthwashes that are going to help with this and eventually the bracket will need to be removed.

## 2020-06-13 NOTE — ED Notes (Signed)
See triage note, pt reports gum pain to left side of mouth for 2 days. Just moved here and does not have dentist

## 2020-08-02 IMAGING — DX DG FOOT COMPLETE 3+V*L*
3 series · 3 of 3 positions shown · non-contrast
Comparison: None.

CLINICAL DATA: Left foot pain

EXAM:
LEFT FOOT - COMPLETE 3+ VIEW

[foot ap]
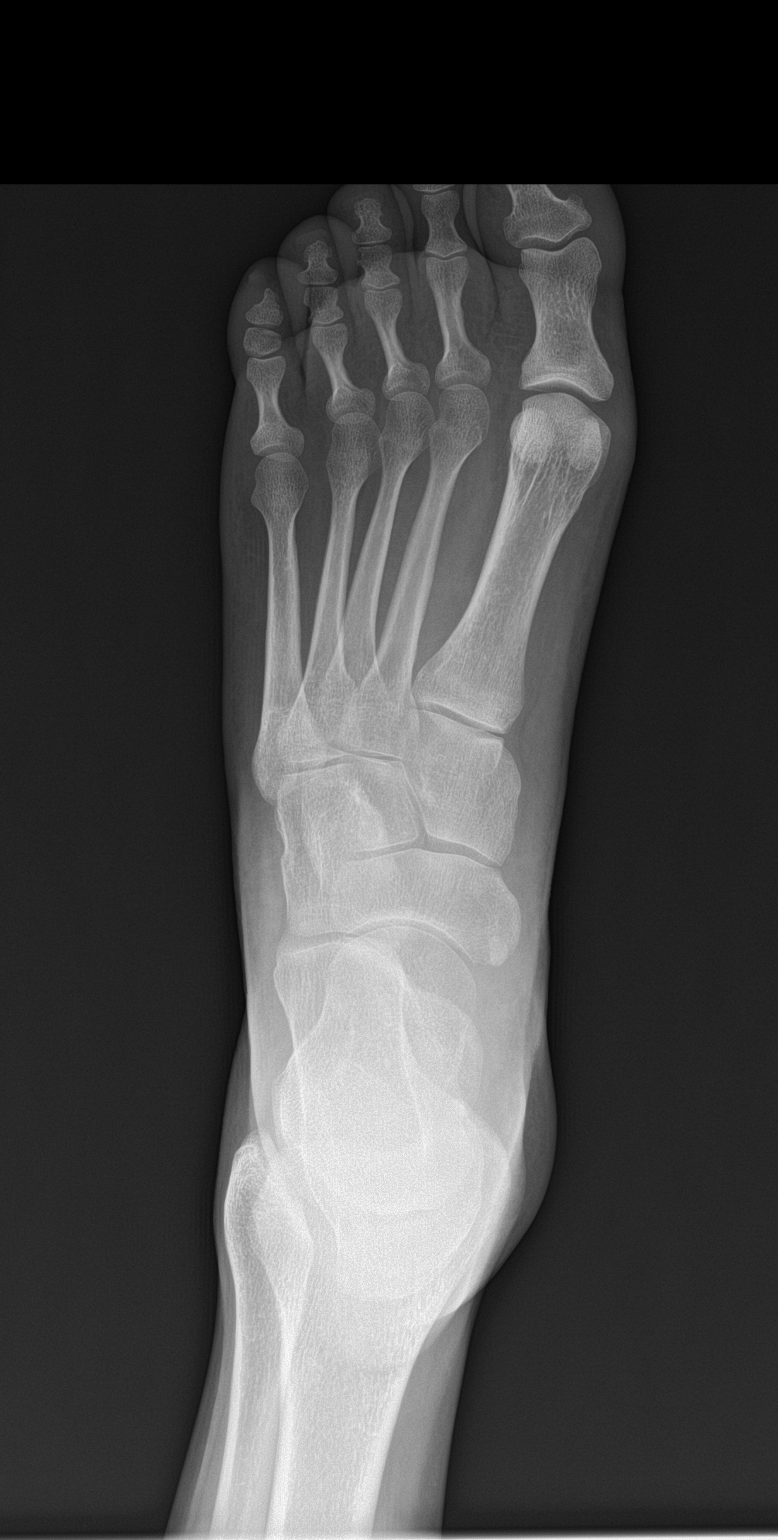

[foot obl]
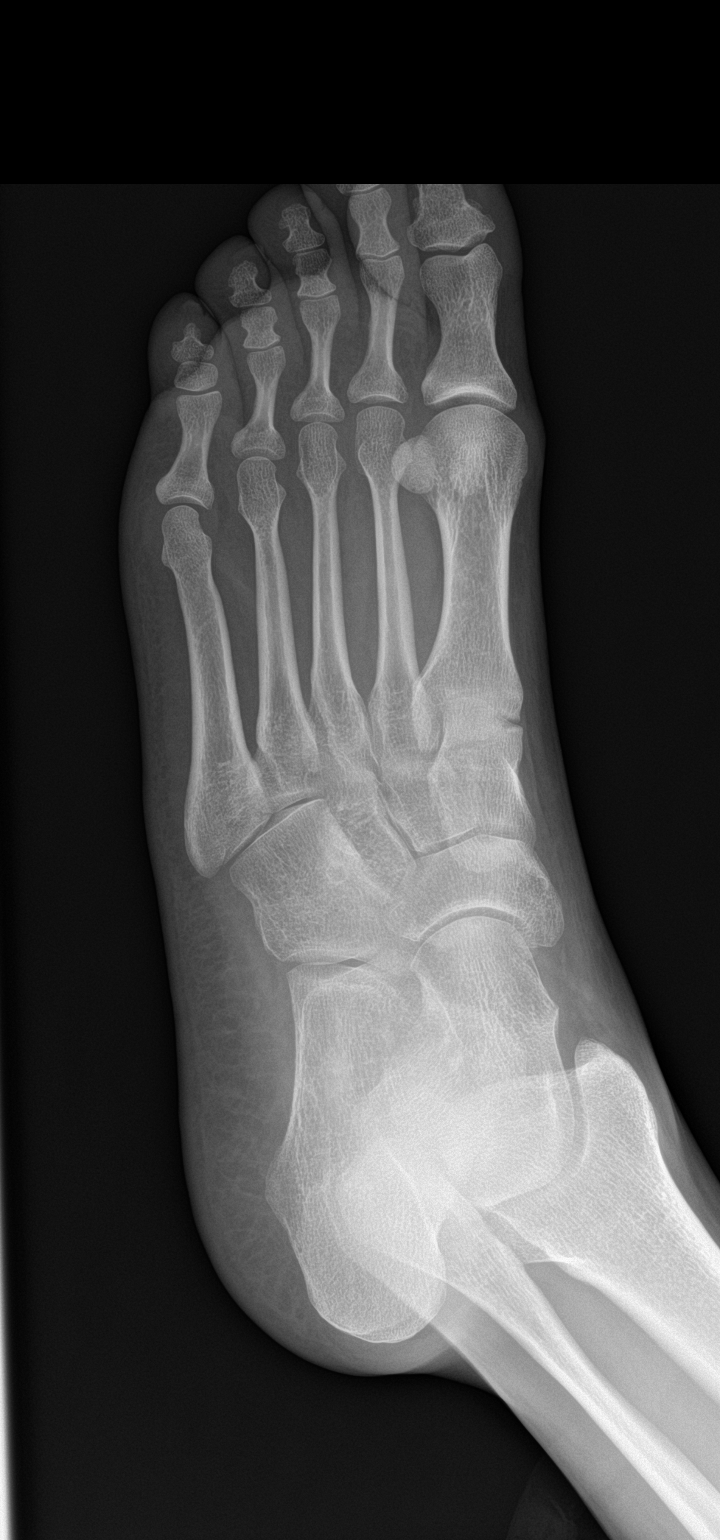

[foot lat]
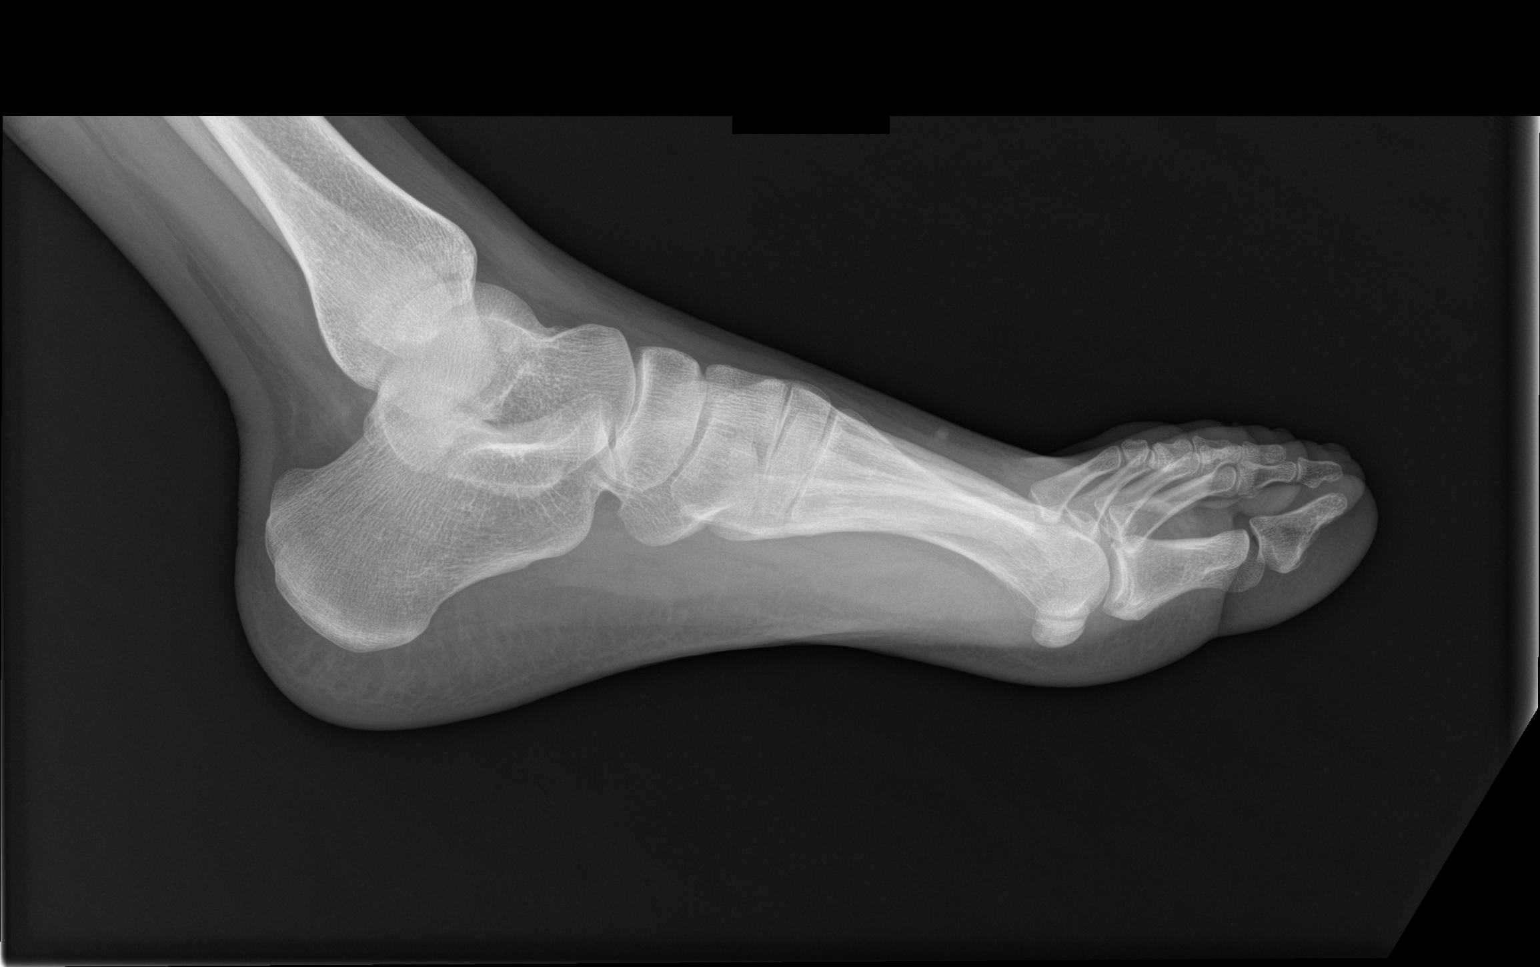

[3 of 3 positions shown; findings below may reference images not displayed]

FINDINGS: No fracture or dislocation is seen.

The joint spaces are preserved.

Visualized soft tissues are within normal limits.
IMPRESSION: Negative.

## 2020-09-15 ENCOUNTER — Emergency Department
Admission: EM | Admit: 2020-09-15 | Discharge: 2020-09-15 | Disposition: A | Discharge status: Home / Self Care | Payer: Medicaid Other | Attending: Emergency Medicine | Admitting: Emergency Medicine

## 2020-09-15 ENCOUNTER — Other Ambulatory Visit: Payer: Self-pay

## 2020-09-15 DIAGNOSIS — G43109 Migraine with aura, not intractable, without status migrainosus: Secondary | ICD-10-CM | POA: Insufficient documentation

## 2020-09-15 DIAGNOSIS — F17210 Nicotine dependence, cigarettes, uncomplicated: Secondary | ICD-10-CM | POA: Insufficient documentation

## 2020-09-15 MED ORDER — DIPHENHYDRAMINE HCL 50 MG/ML IJ SOLN
25.0000 mg | Freq: Once | INTRAMUSCULAR | Status: AC
  Administered 2020-09-15: 25 mg via INTRAVENOUS
  Filled 2020-09-15: qty 1

## 2020-09-15 MED ORDER — DEXAMETHASONE SODIUM PHOSPHATE 4 MG/ML IJ SOLN
4.0000 mg | Freq: Once | INTRAMUSCULAR | Status: AC
  Administered 2020-09-15: 4 mg via INTRAVENOUS
  Filled 2020-09-15: qty 1

## 2020-09-15 MED ORDER — SODIUM CHLORIDE 0.9 % IV BOLUS
1000.0000 mL | Freq: Once | INTRAVENOUS | Status: AC
  Administered 2020-09-15: 1000 mL via INTRAVENOUS

## 2020-09-15 MED ORDER — PROCHLORPERAZINE EDISYLATE 10 MG/2ML IJ SOLN
10.0000 mg | Freq: Once | INTRAMUSCULAR | Status: AC
  Administered 2020-09-15: 10 mg via INTRAVENOUS
  Filled 2020-09-15: qty 2

## 2020-09-15 NOTE — ED Provider Notes (Signed)
[facility] Emergency Department Provider Note   ____________________________________________   Event Date/Time   First MD Initiated Contact with Patient 09/15/20 [phone]     (approximate)  I have reviewed the triage vital signs and the nursing notes.   HISTORY  Chief Complaint Migraine    HPI Haley Shepherd is a 40 y.o. female who reports she is having her usual migraine but it is lasting much longer than it has in the past.  It got better and then came back again.  It is throbbing.  She has blue spots in front of her eyes which come on before the headache and then go away later.  These sound like her aura.  There is no fever.  There is vomiting.        Past Medical History:  Diagnosis Date   Bartholin cyst    Depression    postpartum   Infection    HSV    Patient Active Problem List   Diagnosis Date Noted   Anemia 01/03/2011   Twins 01/03/2011   S/P tubal ligation 01/03/2011    Past Surgical History:  Procedure Laterality Date   BARTHOLIN GLAND CYST EXCISION     TUBAL LIGATION  01/02/2011   Procedure: POST PARTUM TUBAL LIGATION;  Surgeon: Esmeralda Arthur, MD;  Location: [facility];  Service: Gynecology;  Laterality: Bilateral;   VAGINAL DELIVERY  01/01/2011   Procedure: VAGINAL DELIVERY;  Surgeon: Janine Limbo, MD;  Location: [facility];  Service: Gynecology;  Laterality: N/A;    Prior to Admission medications   Not on File    Allergies Patient has no known allergies.  Family History  Problem Relation Age of Onset   Asthma Mother    Asthma Sister    Asthma Son     Social History Social History   Tobacco Use   Smoking status: Every Day    Packs/day: 0.25    Types: Cigarettes   Smokeless tobacco: Never  Vaping Use   Vaping Use: Never used  Substance Use Topics   Alcohol use: Yes    Alcohol/week: 1.0 standard drink    Types: 1 Shots of liquor per week   Drug use: No    Review of Systems  Constitutional: No  fever/chills Eyes: No visual changes except as noted in HPI. ENT: No sore throat. Cardiovascular: Denies chest pain. Respiratory: Denies shortness of breath. Gastrointestinal: No abdominal pain.   nausea,  vomiting.  No diarrhea.  No constipation. Genitourinary: Negative for dysuria. Musculoskeletal: Negative for back pain. Skin: Negative for rash. Neurological: Negative for focal weakness or numbness.   ____________________________________________   PHYSICAL EXAM:  VITAL SIGNS: ED Triage Vitals  Enc Vitals Group     BP 09/15/20 0821 112/69     Pulse Rate 09/15/20 0821 92     Resp 09/15/20 0821 18     Temp 09/15/20 0821 98.2 F (36.8 C)     Temp Source 09/15/20 0821 Oral     SpO2 09/15/20 0821 100 %     Weight 09/15/20 0822 104 lb (47.2 kg)     Height 09/15/20 0822 5\' 4"  (1.626 m)     Head Circumference --      Peak Flow --      Pain Score 09/15/20 0822 9     Pain Loc --      Pain Edu? --      Excl. in GC? --     Constitutional: Alert and oriented.  Shading her  eyes from light Eyes: Conjunctivae are normal. PER. EOMI. Head: Atraumatic. Nose: No congestion/rhinnorhea. Mouth/Throat: Mucous membranes are moist.  Oropharynx non-erythematous. Neck: No stridor.  Cardiovascular: Normal rate, regular rhythm. Grossly normal heart sounds.  Good peripheral circulation. Respiratory: Normal respiratory effort.  No retractions. Lungs CTAB. Gastrointestinal: Soft and nontender. No distention. No abdominal bruits. No CVA tenderness. Musculoskeletal: No lower extremity tenderness nor edema.   Neurologic:  Normal speech and language. No gross focal neurologic deficits are appreciated.  Cranial nerves II through XII are intact visual fields were not checked.  Finger-nose is normal motor strength is 5/5 throughout patient does not report any numbness. Skin:  Skin is warm, dry and intact. No rash noted.   ____________________________________________   LABS (all labs ordered are  listed, but only abnormal results are displayed)  Labs Reviewed - No data to display ____________________________________________  EKG   ____________________________________________  RADIOLOGY Jill Poling, personally viewed and evaluated these images (plain radiographs) as part of my medical decision making, as well as reviewing the written report by the radiologist.  ED MD interpretation:   Official radiology report(s): No results found.  ____________________________________________   PROCEDURES  Procedure(s) performed (including Critical Care):  Procedures   ____________________________________________   INITIAL IMPRESSION / ASSESSMENT AND PLAN / ED COURSE  After fluids and medicines patient's headache is much better.  She wants to try to go home and rest.  I will let her do that.  She will return if she has any further problems.             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    Arnaldo Natal, MD 09/15/20 1023

## 2020-09-15 NOTE — ED Triage Notes (Signed)
First nurse note: Pt c/o migraine x1 week per pt report

## 2020-09-15 NOTE — ED Triage Notes (Signed)
Pt states she has had a migraine x1 week- pt states that she has a history of migraines- pt states this one is throbbing and has lasted longer than her normal migraines

## 2020-09-15 NOTE — Discharge Instructions (Addendum)
Please return as needed.  Especially for worsening headache vomiting or fever.  Rest today and take it easy.

## 2021-09-06 ENCOUNTER — Emergency Department
Admission: EM | Admit: 2021-09-06 | Discharge: 2021-09-06 | Disposition: A | Discharge status: Home / Self Care | Payer: Medicaid Other | Attending: Emergency Medicine | Admitting: Emergency Medicine

## 2021-09-06 ENCOUNTER — Emergency Department: Payer: Medicaid Other

## 2021-09-06 ENCOUNTER — Other Ambulatory Visit: Payer: Self-pay

## 2021-09-06 DIAGNOSIS — R1032 Left lower quadrant pain: Secondary | ICD-10-CM

## 2021-09-06 LAB — COMPREHENSIVE METABOLIC PANEL
ALT: 10 U/L (ref 0–44)
AST: 18 U/L (ref 15–41)
Albumin: 3.7 g/dL (ref 3.5–5.0)
Alkaline Phosphatase: 61 U/L (ref 38–126)
Anion gap: 6 (ref 5–15)
BUN: 16 mg/dL (ref 6–20)
CO2: 24 mmol/L (ref 22–32)
Calcium: 9.1 mg/dL (ref 8.9–10.3)
Chloride: 109 mmol/L (ref 98–111)
Creatinine, Ser: 0.76 mg/dL (ref 0.44–1.00)
GFR, Estimated: >60 mL/min (ref >60)
Glucose, Bld: 87 mg/dL (ref 70–99)
Potassium: 3.7 mmol/L (ref 3.5–5.1)
Sodium: 139 mmol/L (ref 135–145)
Total Bilirubin: 0.6 mg/dL (ref 0.3–1.2)
Total Protein: 7.4 g/dL (ref 6.5–8.1)

## 2021-09-06 LAB — CBC
HCT: 40.3 % (ref 36.0–46.0)
Hemoglobin: 12.8 g/dL (ref 12.0–15.0)
MCH: 28.6 pg (ref 26.0–34.0)
MCHC: 31.8 g/dL (ref 30.0–36.0)
MCV: 90 fL (ref 80.0–100.0)
Platelets: 223 10*3/uL (ref 150–400)
RBC: 4.48 MIL/uL (ref 3.87–5.11)
RDW: 12.3 % (ref 11.5–15.5)
WBC: 6.7 10*3/uL (ref 4.0–10.5)
nRBC: 0 % (ref 0.0–0.2)

## 2021-09-06 LAB — URINALYSIS, ROUTINE W REFLEX MICROSCOPIC
Bilirubin Urine: NEGATIVE
Glucose, UA: NEGATIVE mg/dL
Hgb urine dipstick: NEGATIVE
Ketones, ur: NEGATIVE mg/dL
Leukocytes,Ua: NEGATIVE
Nitrite: NEGATIVE
Protein, ur: NEGATIVE mg/dL
Specific Gravity, Urine: 1.019 (ref 1.005–1.030)
pH: 6 (ref 5.0–8.0)

## 2021-09-06 LAB — LIPASE, BLOOD: Lipase: 26 U/L (ref 11–51)

## 2021-09-06 LAB — POC URINE PREG, ED: Preg Test, Ur: NEGATIVE

## 2021-09-06 NOTE — ED Provider Triage Note (Signed)
  Emergency Medicine Provider Triage Evaluation Note  Haley Shepherd , a 42 y.o.female,  was evaluated in triage.  Pt complains of left lower quadrant pain that has been on and off for the past year.  Reports increased worsening over the past few days.  Describes pain as sharp, 7/10.  She has not received any imaging for this issue yet.   Review of Systems  Positive: Left lower quadrant pain. Negative: Denies fever, chest pain, vomiting  Physical Exam   Vitals:   09/06/21 1504  BP: 104/74  Pulse: 81  Resp: 16  Temp: 99.2 F (37.3 C)  SpO2: 99%   Gen:   Awake, no distress   Resp:  Normal effort  MSK:   Moves extremities without difficulty  Other:    Medical Decision Making  Given the patient's initial medical screening exam, the following diagnostic evaluation has been ordered. The patient will be placed in the appropriate treatment space, once one is available, to complete the evaluation and treatment. I have discussed the plan of care with the patient and I have advised the patient that an ED physician or mid-level practitioner will reevaluate their condition after the test results have been received, as the results may give them additional insight into the type of treatment they may need.    Diagnostics: Labs, transvaginal ultrasound.  Treatments: none immediately   Varney Daily, [place] 09/06/21 1512

## 2021-09-06 NOTE — ED Triage Notes (Signed)
Pt to ED for LLQ abdominal pain since 2 nights ago. States pain is sharp and intermittent, "like a knotting or twisting". Denies NVD. LNMP was 7/8.  Steady gait, NAD.

## 2021-09-06 NOTE — Discharge Instructions (Signed)
You did not wish to have any further testing in the emergency department today.  Your blood work, urine, and ultrasound are normal.  Please return the emergency department immediately if you develop any continued, new, or worsening symptoms, or if you develop fever, chills, vomiting, vaginal discharge or bleeding, or any other concerns.

## 2021-09-06 NOTE — ED Provider Notes (Signed)
[facility] Provider Note    Event Date/Time   First MD Initiated Contact with Patient 09/06/21 1738     (approximate)   History   Abdominal Pain   HPI  Haley Shepherd is a 41 y.o. female with a past medical history of ovarian cysts and tubal ligation who presents today for evaluation of left lower quadrant pain.  Reports that this has been ongoing for the past 2 days.  She reports that the pain is sharp and intermittent and describes it as a twisting.  Her last period was 3 weeks ago.  Patient reports that her pain began 2 days ago and she has had something similar in the past.  She reports that she has not had pain in the past several hours.  She reports that she feels back to her baseline.  She denies any vaginal discharge or bleeding.  She denies fevers or chills.  She denies dysuria or hematuria.  She has not had any vomiting or diarrhea.  Patient Active Problem List   Diagnosis Date Noted   Anemia 01/03/2011   Twins 01/03/2011   S/P tubal ligation 01/03/2011          Physical Exam   Triage Vital Signs: ED Triage Vitals [09/06/21 1504]  Enc Vitals Group     BP 104/74     Pulse Rate 81     Resp 16     Temp 99.2 F (37.3 C)     Temp Source Oral     SpO2 99 %     Weight 102 lb (46.3 kg)     Height 5\' 4"  (1.626 m)     Head Circumference      Peak Flow      Pain Score 7     Pain Loc      Pain Edu?      Excl. in GC?     Most recent vital signs: Vitals:   09/06/21 1504  BP: 104/74  Pulse: 81  Resp: 16  Temp: 99.2 F (37.3 C)  SpO2: 99%    Physical Exam Vitals and nursing note reviewed.  Constitutional:      General: Awake and alert. No acute distress.    Appearance: Normal appearance. The patient is normal weight.  HENT:     Head: Normocephalic and atraumatic.     Mouth: Mucous membranes are moist.  Eyes:     General: PERRL. Normal EOMs        Right eye: No discharge.        Left eye: No discharge.     Conjunctiva/sclera:  Conjunctivae normal.  Cardiovascular:     Rate and Rhythm: Normal rate and regular rhythm.     Pulses: Normal pulses.     Heart sounds: Normal heart sounds Pulmonary:     Effort: Pulmonary effort is normal. No respiratory distress.     Breath sounds: Normal breath sounds.  Abdominal:     Abdomen is soft. There is no abdominal tenderness. No rebound or guarding. No distention. Musculoskeletal:        General: No swelling. Normal range of motion.     Cervical back: Normal range of motion and neck supple.  Skin:    General: Skin is warm and dry.     Capillary Refill: Capillary refill takes less than 2 seconds.     Findings: No rash.  Neurological:     Mental Status: The patient is awake and alert.  ED Results / Procedures / Treatments   Labs (all labs ordered are listed, but only abnormal results are displayed) Labs Reviewed  URINALYSIS, ROUTINE W REFLEX MICROSCOPIC - Abnormal; Notable for the following components:      Result Value   Color, Urine YELLOW (*)    APPearance HAZY (*)    All other components within normal limits  LIPASE, BLOOD  COMPREHENSIVE METABOLIC PANEL  CBC  POC URINE PREG, ED     EKG     RADIOLOGY I independently reviewed and interpreted imaging and agree with radiologists findings.     PROCEDURES:  Critical Care performed:   Procedures   MEDICATIONS ORDERED IN ED: Medications - No data to display   IMPRESSION / MDM / ASSESSMENT AND PLAN / ED COURSE  I reviewed the triage vital signs and the nursing notes.   Differential diagnosis includes, but is not limited to, ovarian cyst, ovarian torsion, diverticulitis, UTI, ectopic pregnancy.  Patient is awake and alert, hemodynamically stable and afebrile.  She reports that her pain level is currently 0.  Labs and urine obtained at triage are overall reassuring.  Ultrasound of her pelvis also obtained at triage demonstrates no cyst or torsion.  Recommended further work-up including pelvic  exam and further advanced imaging including possibly a CT scan, the patient declined.  She reports that she feels significantly improved and would rather just go home.  She understands the risks of missing further etiology of her pain.  She promises to return for any new, worsening, or change in symptoms or other concerns.  She reports that she has a follow-up appointment next week and she would prefer to go to that appointment rather than have further work-up today.  She declined analgesia.  She understands return precautions.  She was discharged in stable condition.   Patient's presentation is most consistent with acute complicated illness / injury requiring diagnostic workup.   Clinical Course as of 09/06/21 1829  Caleen Essex Sep 06, 2021  7425 Patient reports that she feels much better and declines further workup. Requesting discharge [JP]    Clinical Course User Index [JP] Nema Oatley, Herb Grays, PA-C     FINAL CLINICAL IMPRESSION(S) / ED DIAGNOSES   Final diagnoses:  Left lower quadrant abdominal pain     Rx / DC Orders   ED Discharge Orders     None        Note:  This document was prepared using Dragon voice recognition software and may include unintentional dictation errors.   Keturah Shavers 09/06/21 2036    Chesley Noon, MD 09/07/21 [phone]

## 2021-09-10 ENCOUNTER — Emergency Department
Admission: EM | Admit: 2021-09-10 | Discharge: 2021-09-10 | Disposition: A | Discharge status: Home / Self Care | Payer: 59 | Attending: Emergency Medicine | Admitting: Emergency Medicine

## 2021-09-10 ENCOUNTER — Other Ambulatory Visit: Payer: Self-pay

## 2021-09-10 DIAGNOSIS — K047 Periapical abscess without sinus: Secondary | ICD-10-CM | POA: Insufficient documentation

## 2021-09-10 DIAGNOSIS — K0889 Other specified disorders of teeth and supporting structures: Secondary | ICD-10-CM | POA: Diagnosis present

## 2021-09-10 MED ORDER — AMOXICILLIN 875 MG PO TABS: 875.0000 mg | ORAL_TABLET | Freq: Two times a day (BID) | ORAL | 0 refills | Status: DC

## 2021-09-10 NOTE — ED Triage Notes (Signed)
Pt in from home due to issue with gums; used ibuprofen without relief; pain started yesterday; R upper; bleeding noted when brushing teeth per pt. Pt in NAD.

## 2021-09-10 NOTE — ED Notes (Signed)
Provider assessed pt's gum which is notably swollen.

## 2021-09-10 NOTE — Discharge Instructions (Signed)
Follow up with one of the clinics listed on your discharge instructions.  [place] and [facility] have walk in hours  OPTIONS FOR DENTAL FOLLOW UP CARE  [place] Department of Health and Human Services - Local Safety Net Dental Clinics [email or web address]   [facility] [phone])  [facility] [phone])  [place] [phone] ext [phone])  [facility] [phone])  [facility] [phone]) This clinic caters to the indigent population and is on a lottery system. Location: [employer] [facility], [employer], [address], [place] Clinic Hours: Wednesdays from 6pm - 9pm, patients seen by a lottery system. For dates, call or go to [email or web address] Services: Cleanings, fillings and simple extractions. Payment Options: DENTAL WORK IS FREE OF CHARGE. Bring proof of income or support. Best way to get seen: Arrive at 5:15 pm - this is a lottery, NOT first come/first serve, so arriving earlier will not increase your chances of being seen.     [facility] [phone] Select option 1 for emergencies   Location: [facility], [place], [address] [place] Clinic Hours: No walk-ins accepted - call the day before to schedule an appointment. Check in times are 9:30 am and 1:30 pm. Services: Simple extractions, temporary fillings, pulpectomy/pulp debridement, uncomplicated abscess drainage. Payment Options: PAYMENT IS DUE AT THE TIME OF SERVICE.  Fee is usually $100-200, additional surgical procedures (e.g. abscess drainage) may be extra. Cash, checks, Visa/MasterCard accepted.  Can file Medicaid if patient is covered for dental - patient should call case worker to check. No discount for [facility] patients. Best way to get seen: MUST call the day before and get onto the  schedule. Can usually be seen the next 1-2 days. No walk-ins accepted.     [facility] [phone]   Location: [facility], [address] [place] Clinic Hours: M, W, Th, F 8am or 1:30pm, Tues 9a or 1:30 - first come/first served. Services: Simple extractions, temporary fillings, uncomplicated abscess drainage.  You do not need to be an [facility] resident. Payment Options: PAYMENT IS DUE AT THE TIME OF SERVICE. Dental insurance, otherwise sliding scale - bring proof of income or support. Depending on income and treatment needed, cost is usually $50-200. Best way to get seen: Arrive early as it is first come/first served.     [facility] [phone]   Location: [address] Clinic Hours: Mon-Thu 8a-5p Services: Most basic dental services including extractions and fillings. Payment Options: PAYMENT IS DUE AT THE TIME OF SERVICE. Sliding scale, up to 50% off - bring proof if income or support. Medicaid with dental option accepted. Best way to get seen: Call to schedule an appointment, can usually be seen within 2 weeks OR they will try to see walk-ins - show up at 8a or 2p (you may have to wait).     [facility] [phone] [place] RESIDENTS ONLY   Location: [facility], [address] [place], [place] [address] Clinic Hours: By appointment only. Monday - Thursday 8am-5pm, Friday 8am-12pm Services: Cleanings, fillings, extractions. Payment Options: PAYMENT IS DUE AT THE TIME OF SERVICE. Cash, Visa or MasterCard. Sliding scale - $30 minimum per service. Best way to get seen: Come in to office, complete packet and make an appointment - need proof of income or support monies for each household member and proof of [facility] residence.  Usually takes about a month to get in.     [facility] [phone]    Location: [address], [facility] Clinic Hours: Walk-in Urgent Care Dental Services are offered Monday-Friday mornings only. The numbers of emergencies accepted daily is limited to the number of providers available. Maximum 15 - Mondays, Wednesdays & Thursdays Maximum 10 - Tuesdays & Fridays Services: You do not need to be a [facility] resident to be seen for a dental emergency. Emergencies are defined as pain, swelling, abnormal bleeding, or dental trauma. Walkins will receive x-rays if needed. NOTE: Dental cleaning is not an emergency. Payment Options: PAYMENT IS DUE AT THE TIME OF SERVICE. Minimum co-pay is $40.00 for uninsured patients. Minimum co-pay is $3.00 for Medicaid with dental coverage. Dental Insurance is accepted and must be presented at time of visit. Medicare does not cover dental. Forms of payment: Cash, credit card, checks. Best way to get seen: If not previously registered with the clinic, walk-in dental registration begins at 7:15 am and is on a first come/first serve basis. If previously registered with the clinic, call to make an appointment.     [facility] [phone] [place] RESIDENTS ONLY   Location: [address], [place], [place] Clinic Hours: Mon-Thu 10a-2p Services: Extractions only! Payment Options: FREE (donations accepted) - bring proof of income or support Best way to get seen: Call and schedule an appointment OR come at 8am on the 1st Monday of every month (except for holidays) when it is first come/first served.     [facility] [phone]   Location: [address] [place], [place] Clinic Hours: Friday mornings Services, Payment Options, Best way to get seen: Call for info

## 2021-09-10 NOTE — ED Provider Triage Note (Signed)
Emergency Medicine Provider Triage Evaluation Note  Haley Shepherd , a 41 y.o. female  was evaluated in triage.  Pt complains of dental pain yesterday. Right upper dental pain.  No dentist.    Review of Systems  Positive: + dental pain Negative: - fever  Physical Exam  LMP 08/17/2021 (Approximate)  Gen:   Awake, no distress   Resp:  Normal effort  MSK:   Moves extremities without difficulty  Other:  Right upper molar poor repair, gum edematous.  No active drainage.  Medical Decision Making  Medically screening exam initiated at 1:11 PM.  Appropriate orders placed.  Haley Shepherd was informed that the remainder of the evaluation will be completed by another provider, this initial triage assessment does not replace that evaluation, and the importance of remaining in the ED until their evaluation is complete.     Tommi Rumps, PA-C 09/10/21 1322

## 2021-09-10 NOTE — ED Provider Notes (Signed)
   [facility] Provider Note    None    (approximate)   History   Dental Pain   HPI  Haley Shepherd is a 41 y.o. female  dental pain x 1 day.   Patient states that she does not have a dentist.        Physical Exam   Triage Vital Signs: ED Triage Vitals  Enc Vitals Group     BP      Pulse      Resp      Temp      Temp src      SpO2      Weight      Height      Head Circumference      Peak Flow      Pain Score      Pain Loc      Pain Edu?      Excl. in GC?     Most recent vital signs: Vitals:   09/10/21 1313  BP: 119/75  Pulse: 63  Resp: 17  Temp: 98.6 F (37 C)  SpO2: 100%     General: Awake, no distress.  CV:  Good peripheral perfusion.  Resp:  Normal effort.  Abd:  No distention.  Other:  Right upper molar in poor repair and gums are edematous.  No active drainage is noted.   ED Results / Procedures / Treatments   Labs (all labs ordered are listed, but only abnormal results are displayed) Labs Reviewed - No data to display      PROCEDURES:  Critical Care performed:   Procedures   MEDICATIONS ORDERED IN ED: Medications - No data to display   IMPRESSION / MDM / ASSESSMENT AND PLAN / ED COURSE  I reviewed the triage vital signs and the nursing notes.   Differential diagnosis includes, but is not limited to, dental pain, gingivitis, dental injury, dental abscess.  41 year old female presents to the ED with complaint of dental pain for 1 day.  Physical exam is consistent with a early dental abscess right upper molar.  Patient was given list of dental clinics to contact and also a prescription for amoxicillin 875 twice daily was sent to her pharmacy.  She may take Tylenol or ibuprofen as needed for pain.      Patient's presentation is most consistent with acute, uncomplicated illness.  FINAL CLINICAL IMPRESSION(S) / ED DIAGNOSES   Final diagnoses:  Pain, dental     Rx / DC Orders   ED Discharge  Orders          Ordered    amoxicillin (AMOXIL) 875 MG tablet  2 times daily        09/10/21 1315             Note:  This document was prepared using Dragon voice recognition software and may include unintentional dictation errors.   Tommi Rumps, PA-C 09/10/21 1321    Minna Antis, MD 09/10/21 1921

## 2021-09-13 ENCOUNTER — Other Ambulatory Visit: Payer: Self-pay

## 2021-09-13 ENCOUNTER — Encounter: Payer: Self-pay | Admitting: Emergency Medicine

## 2021-09-13 ENCOUNTER — Emergency Department
Admission: EM | Admit: 2021-09-13 | Discharge: 2021-09-13 | Disposition: A | Discharge status: Home / Self Care | Payer: 59 | Attending: Emergency Medicine | Admitting: Emergency Medicine

## 2021-09-13 DIAGNOSIS — K0889 Other specified disorders of teeth and supporting structures: Secondary | ICD-10-CM | POA: Diagnosis present

## 2021-09-13 DIAGNOSIS — K047 Periapical abscess without sinus: Secondary | ICD-10-CM | POA: Insufficient documentation

## 2021-09-13 MED ORDER — NAPROXEN 500 MG PO TABS: 500.0000 mg | ORAL_TABLET | Freq: Two times a day (BID) | ORAL | 0 refills | Status: AC

## 2021-09-13 MED ORDER — TRAMADOL HCL 50 MG PO TABS: 50.0000 mg | ORAL_TABLET | Freq: Four times a day (QID) | ORAL | 0 refills | Status: AC | PRN

## 2021-09-13 MED ORDER — CHLORHEXIDINE GLUCONATE 0.12 % MT SOLN: 15.0000 mL | Freq: Two times a day (BID) | OROMUCOSAL | 0 refills | Status: DC

## 2021-09-13 MED ORDER — CLINDAMYCIN HCL 300 MG PO CAPS: 300.0000 mg | ORAL_CAPSULE | Freq: Three times a day (TID) | ORAL | 0 refills | Status: AC

## 2021-09-13 NOTE — ED Notes (Signed)
See triage note  Presents with increased dental pain  States she is taking antibiotics but pain  is worse

## 2021-09-13 NOTE — ED Provider Notes (Signed)
[facility] Provider Note    Event Date/Time   First MD Initiated Contact with Patient 09/13/21 1436     (approximate)   History   Dental Pain   HPI  Haley Shepherd is a 41 y.o. female with no significant past medical history presents to the emergency department for treatment and evaluation of ongoing dental pain now with facial swelling.  Symptoms have been present for the past 4 to 5 days.  She reports a subjective fever.  She was evaluated here on Wednesday and given a prescription for amoxicillin.  She states that she is taking it as prescribed along with over-the-counter pain medications without any relief.  Facial swelling seems to be worse when she wakes up in the mornings.  Past Medical History:  Diagnosis Date   Bartholin cyst    Depression    postpartum   Infection    HSV     Physical Exam   Triage Vital Signs: ED Triage Vitals [09/13/21 1420]  Enc Vitals Group     BP 125/84     Pulse Rate 73     Resp 16     Temp 98.4 F (36.9 C)     Temp Source Oral     SpO2 99 %     Weight 102 lb (46.3 kg)     Height 5\' 4"  (1.626 m)     Head Circumference      Peak Flow      Pain Score 10     Pain Loc      Pain Edu?      Excl. in GC?     Most recent vital signs: Vitals:   09/13/21 1420  BP: 125/84  Pulse: 73  Resp: 16  Temp: 98.4 F (36.9 C)  SpO2: 99%    General: Awake, no distress.  CV:  Good peripheral perfusion.  Resp:  Normal effort.  Abd:  No distention.  Other:  Tooth #6 chronically appearing dental fracture. Widespread gingivitis and gum regression.   ED Results / Procedures / Treatments   Labs (all labs ordered are listed, but only abnormal results are displayed) Labs Reviewed - No data to display   EKG     RADIOLOGY  Not indicated.  I have independently reviewed and interpreted imaging as well as reviewed report from radiology.  PROCEDURES:  Critical Care performed: No  Procedures   MEDICATIONS  ORDERED IN ED:  Medications - No data to display   IMPRESSION / MDM / ASSESSMENT AND PLAN / ED COURSE   I reviewed the triage vital signs and the nursing notes.  Differential diagnosis includes, but is not limited to: Dental abscess, gingivitis, dental pain  Patient's presentation is most consistent with acute, uncomplicated illness.  41 year old female presenting to the emergency department for treatment and evaluation of ongoing dental pain.  See HPI for further details.  On exam, she does have some mild right side facial swelling associated with the area of dental pain.  She also has significant gingivitis and regression of the gums.  There is no fluctuant area that appears drainable.  Plan will be to change the antibiotic, give her very short course of meds for pain control, and oral rinse.  She was encouraged to keep her dental appointment as scheduled.  If symptoms change or worsen and she is unable to get in sooner, she is to return to the emergency department.      FINAL CLINICAL IMPRESSION(S) / ED DIAGNOSES  Final diagnoses:  Dental abscess     Rx / DC Orders   ED Discharge Orders          Ordered    clindamycin (CLEOCIN) 300 MG capsule  3 times daily        09/13/21 1450    naproxen (NAPROSYN) 500 MG tablet  2 times daily with meals        09/13/21 1450    traMADol (ULTRAM) 50 MG tablet  Every 6 hours PRN        09/13/21 1450    chlorhexidine (PERIDEX) 0.12 % solution  2 times daily        09/13/21 1450             Note:  This document was prepared using Dragon voice recognition software and may include unintentional dictation errors.   Chinita Pester, FNP 09/13/21 1451    Sharman Cheek, MD 09/13/21 1921

## 2021-09-13 NOTE — Discharge Instructions (Addendum)
Please follow-up with your dentist as scheduled or sooner if possible.  If symptoms change or worsen and you are unable to be evaluated by the dentist, see primary care or return to the emergency department.

## 2021-09-13 NOTE — ED Triage Notes (Signed)
Patient states she was here earlier this week for pain to mouth but cannot get into dentist until next Thursday and pain is too severe.

## 2022-09-15 ENCOUNTER — Other Ambulatory Visit: Payer: Self-pay

## 2022-09-15 ENCOUNTER — Encounter: Payer: Self-pay | Admitting: Emergency Medicine

## 2022-09-15 ENCOUNTER — Emergency Department
Admission: EM | Admit: 2022-09-15 | Discharge: 2022-09-15 | Disposition: A | Discharge status: Home / Self Care | Payer: 59 | Attending: Student in an Organized Health Care Education/Training Program | Admitting: Student in an Organized Health Care Education/Training Program

## 2022-09-15 DIAGNOSIS — R300 Dysuria: Secondary | ICD-10-CM | POA: Diagnosis present

## 2022-09-15 DIAGNOSIS — N390 Urinary tract infection, site not specified: Secondary | ICD-10-CM | POA: Diagnosis not present

## 2022-09-15 LAB — URINALYSIS, ROUTINE W REFLEX MICROSCOPIC
Bilirubin Urine: NEGATIVE
Glucose, UA: NEGATIVE mg/dL
Ketones, ur: NEGATIVE mg/dL
Nitrite: NEGATIVE
Protein, ur: 30 mg/dL — AB
RBC / HPF: >50 RBC/hpf (ref 0–5)
Specific Gravity, Urine: 1.02 (ref 1.005–1.030)
WBC, UA: >50 WBC/hpf (ref 0–5)
pH: 5 (ref 5.0–8.0)

## 2022-09-15 LAB — POC URINE PREG, ED: Preg Test, Ur: NEGATIVE

## 2022-09-15 MED ORDER — CEPHALEXIN 500 MG PO CAPS: 500.0000 mg | ORAL_CAPSULE | Freq: Three times a day (TID) | ORAL | 0 refills | Status: AC

## 2022-09-15 MED ORDER — CEPHALEXIN 500 MG PO CAPS
500.0000 mg | ORAL_CAPSULE | Freq: Once | ORAL | Status: AC
  Administered 2022-09-15: 500 mg via ORAL
  Filled 2022-09-15: qty 1

## 2022-09-15 NOTE — ED Triage Notes (Signed)
Patient to ED via POV for urinary frequency x2-3 weeks. States bilateral lower abd pain. Denies burning. Hx of UTIs.

## 2022-09-15 NOTE — ED Provider Notes (Signed)
[facility] Provider Note    Event Date/Time   First MD Initiated Contact with Patient 09/15/22 [phone]     (approximate)   History   Urinary Frequency   HPI  Haley Shepherd is a 42 y.o. female who presents to the ER for evaluation of dysuria increased urinary frequency and lightning like pain.  States that the symptoms became worse this morning.  Recently treated for UTI and has had these in the past.  Discomfort is right in the middle.  No discharge no fevers.  No flank pain.  No history of stones.         Physical Exam   Triage Vital Signs: ED Triage Vitals  Encounter Vitals Group     BP 09/15/22 0727 113/71     Systolic BP Percentile --      Diastolic BP Percentile --      Pulse Rate 09/15/22 0727 80     Resp 09/15/22 0727 18     Temp 09/15/22 0727 98.3 F (36.8 C)     Temp Source 09/15/22 0727 Oral     SpO2 09/15/22 0727 100 %     Weight 09/15/22 0728 107 lb (48.5 kg)     Height 09/15/22 0728 5\' 4"  (1.626 m)     Head Circumference --      Peak Flow --      Pain Score 09/15/22 0728 7     Pain Loc --      Pain Education --      Exclude from Growth Chart --     Most recent vital signs: Vitals:   09/15/22 0727  BP: 113/71  Pulse: 80  Resp: 18  Temp: 98.3 F (36.8 C)  SpO2: 100%     Constitutional: Alert  Eyes: Conjunctivae are normal.  Head: Atraumatic. Nose: No congestion/rhinnorhea. Mouth/Throat: Mucous membranes are moist.   Neck: Painless ROM.  Cardiovascular:   Good peripheral circulation. Respiratory: Normal respiratory effort.  No retractions.  Gastrointestinal: Soft and nontender with mild discomfort in the lower abdomen Musculoskeletal:  no deformity Neurologic:  MAE spontaneously. No gross focal neurologic deficits are appreciated.      ED Results / Procedures / Treatments   Labs (all labs ordered are listed, but only abnormal results are displayed) Labs Reviewed  URINALYSIS, ROUTINE W REFLEX MICROSCOPIC -  Abnormal; Notable for the following components:      Result Value   Color, Urine YELLOW (*)    APPearance CLOUDY (*)    Hgb urine dipstick LARGE (*)    Protein, ur 30 (*)    Leukocytes,Ua LARGE (*)    Bacteria, UA RARE (*)    All other components within normal limits  POC URINE PREG, ED     EKG     RADIOLOGY    PROCEDURES:  Critical Care performed:   Procedures   MEDICATIONS ORDERED IN ED: Medications  cephALEXin (KEFLEX) capsule 500 mg (500 mg Oral Given 09/15/22 0758)     IMPRESSION / MDM / ASSESSMENT AND PLAN / ED COURSE  I reviewed the triage vital signs and the nursing notes.                              Differential diagnosis includes, but is not limited to, UTI, cystitis, urethritis, appendicitis, ovarian pathology  Patient presented to the ER for evaluation of symptoms as described above.  Patient well-appearing and in no acute distress.  Exam is benign with some mild suprapubic discomfort not consistent with appendicitis or ovarian pathology.  Urinalysis consistent with UTI and history consistent with same.  Does appear stable for outpatient management.        FINAL CLINICAL IMPRESSION(S) / ED DIAGNOSES   Final diagnoses:  Lower urinary tract infectious disease     Rx / DC Orders   ED Discharge Orders          Ordered    cephALEXin (KEFLEX) 500 MG capsule  3 times daily        09/15/22 0756             Note:  This document was prepared using Dragon voice recognition software and may include unintentional dictation errors.    Willy Eddy, MD 09/15/22 0800

## 2023-07-01 ENCOUNTER — Other Ambulatory Visit: Payer: Self-pay | Admitting: Student

## 2023-07-01 DIAGNOSIS — N6310 Unspecified lump in the right breast, unspecified quadrant: Secondary | ICD-10-CM

## 2023-07-01 DIAGNOSIS — N644 Mastodynia: Secondary | ICD-10-CM

## 2023-07-03 ENCOUNTER — Other Ambulatory Visit: Payer: Self-pay | Admitting: Student

## 2023-07-03 ENCOUNTER — Ambulatory Visit
Admission: RE | Admit: 2023-07-03 | Discharge: 2023-07-03 | Disposition: A | Discharge status: Home / Self Care | Source: Ambulatory Visit | Attending: Student | Admitting: Student

## 2023-07-03 ENCOUNTER — Encounter: Payer: Self-pay | Admitting: Student

## 2023-07-03 DIAGNOSIS — N6310 Unspecified lump in the right breast, unspecified quadrant: Secondary | ICD-10-CM

## 2023-07-03 DIAGNOSIS — N6320 Unspecified lump in the left breast, unspecified quadrant: Secondary | ICD-10-CM

## 2023-07-03 DIAGNOSIS — N644 Mastodynia: Secondary | ICD-10-CM | POA: Diagnosis present

## 2023-07-10 ENCOUNTER — Encounter: Payer: Self-pay | Admitting: Pediatrics

## 2023-07-17 ENCOUNTER — Other Ambulatory Visit: Payer: Self-pay | Admitting: Pediatrics

## 2023-07-17 DIAGNOSIS — N6002 Solitary cyst of left breast: Secondary | ICD-10-CM

## 2023-07-29 ENCOUNTER — Other Ambulatory Visit: Payer: Self-pay | Admitting: Pediatrics

## 2023-07-29 ENCOUNTER — Ambulatory Visit
Admission: RE | Admit: 2023-07-29 | Discharge: 2023-07-29 | Disposition: A | Discharge status: Home / Self Care | Source: Ambulatory Visit | Attending: Pediatrics | Admitting: Pediatrics

## 2023-07-29 DIAGNOSIS — N6002 Solitary cyst of left breast: Secondary | ICD-10-CM

## 2023-12-05 ENCOUNTER — Other Ambulatory Visit: Payer: Self-pay

## 2023-12-05 ENCOUNTER — Emergency Department
Admission: EM | Admit: 2023-12-05 | Discharge: 2023-12-05 | Disposition: A | Discharge status: Home / Self Care | Attending: Emergency Medicine | Admitting: Emergency Medicine

## 2023-12-05 ENCOUNTER — Emergency Department

## 2023-12-05 DIAGNOSIS — M79644 Pain in right finger(s): Secondary | ICD-10-CM | POA: Diagnosis present

## 2023-12-05 DIAGNOSIS — L03011 Cellulitis of right finger: Secondary | ICD-10-CM | POA: Insufficient documentation

## 2023-12-05 MED ORDER — CEPHALEXIN 500 MG PO CAPS: 500.0000 mg | ORAL_CAPSULE | Freq: Three times a day (TID) | ORAL | 0 refills | Status: AC

## 2023-12-05 NOTE — ED Notes (Signed)
 Patient declined discharge vital signs.

## 2023-12-05 NOTE — ED Triage Notes (Signed)
 Pt to ED for right thumb pain x2 days. Denies injuries, reports feels swollen .

## 2023-12-05 NOTE — ED Provider Notes (Signed)
 [facility] Provider Note    Event Date/Time   First MD Initiated Contact with Patient 12/05/23 1536     (approximate)   History   Hand Pain   HPI  Haley Shepherd is a 43 y.o. female history of anemia presents emergency department with right thumb pain for 2 days.  Patient does bite her nails.  States tender on the side.  Unable to grab medicine bottles to open at work.  No fever or chills.  No drainage from the area.  No other injury.      Physical Exam   Triage Vital Signs: ED Triage Vitals  Encounter Vitals Group     BP 12/05/23 1458 112/77     Girls Systolic BP Percentile --      Girls Diastolic BP Percentile --      Boys Systolic BP Percentile --      Boys Diastolic BP Percentile --      Pulse Rate 12/05/23 1458 86     Resp 12/05/23 1458 16     Temp 12/05/23 1458 98.3 F (36.8 C)     Temp src --      SpO2 12/05/23 1458 100 %     Weight 12/05/23 1456 107 lb (48.5 kg)     Height 12/05/23 1456 5' 4 (1.626 m)     Head Circumference --      Peak Flow --      Pain Score 12/05/23 1456 7     Pain Loc --      Pain Education --      Exclude from Growth Chart --     Most recent vital signs: Vitals:   12/05/23 1458  BP: 112/77  Pulse: 86  Resp: 16  Temp: 98.3 F (36.8 C)  SpO2: 100%     General: Awake, no distress.   CV:  Good peripheral perfusion.  Resp:  Normal effort.  Abd:  No distention.   Other:  Right thumb with mild swelling tenderness along the cuticle area, no redness or pus.  No bony tenderness, neurovascular intact   ED Results / Procedures / Treatments   Labs (all labs ordered are listed, but only abnormal results are displayed) Labs Reviewed - No data to display   EKG     RADIOLOGY X-ray right thumb    PROCEDURES:   Procedures  Critical Care:  no Chief Complaint  Patient presents with   Hand Pain      MEDICATIONS ORDERED IN ED: Medications - No data to display   IMPRESSION / MDM /  ASSESSMENT AND PLAN / ED COURSE  I reviewed the triage vital signs and the nursing notes.                              Differential diagnosis includes, but is not limited to, paronychia, cellulitis, thumb pain, foreign body, fracture  Patient's presentation is most consistent with acute illness / injury with system symptoms.   X-ray right thumb independently reviewed interpreted by me as being negative for any acute abnormality   Patient's exam is most consistent with a paronychia.  There is no foreign body noted on the x-ray, no known injury, feel that antibiotic and soaking in warm salt water should take care of her symptoms.  However instructions to follow-up with either urgent care, her regular doctor, or return emergency department if worsening.  She is in agreement treatment plan.  Discharged  stable condition.      FINAL CLINICAL IMPRESSION(S) / ED DIAGNOSES   Final diagnoses:  Paronychia of finger of right hand     Rx / DC Orders   ED Discharge Orders          Ordered    cephALEXin  (KEFLEX ) 500 MG capsule  3 times daily        12/05/23 1646             Note:  This document was prepared using Dragon voice recognition software and may include unintentional dictation errors.    Gasper Devere ORN, PA-C 12/05/23 1650    Ernest Ronal BRAVO, MD 12/10/23 [phone]

## 2023-12-05 NOTE — Discharge Instructions (Signed)
 Soak the finger in warm salt water 3 times daily.  This should help alleviate some of the discomfort and swelling.  Take antibiotic as prescribed.  Follow-up with your doctor if not improving to 3 days.  Urgent care could also see for this.  Return for worsening

## 2024-01-14 ENCOUNTER — Ambulatory Visit

## 2024-01-14 ENCOUNTER — Ambulatory Visit: Admitting: Family Medicine
